# Patient Record
Sex: Male | Born: 1960
Health system: Southern US, Community
[De-identification: ages and names within clinical notes are randomized; demographics above are authoritative.]

## PROBLEM LIST (undated history)

## (undated) DIAGNOSIS — I10 Essential (primary) hypertension: Secondary | ICD-10-CM

## (undated) DIAGNOSIS — S62109A Fracture of unspecified carpal bone, unspecified wrist, initial encounter for closed fracture: Secondary | ICD-10-CM

## (undated) DIAGNOSIS — F17200 Nicotine dependence, unspecified, uncomplicated: Secondary | ICD-10-CM

## (undated) DIAGNOSIS — F1020 Alcohol dependence, uncomplicated: Secondary | ICD-10-CM

## (undated) HISTORY — PX: INCISE AND DRAIN ABCESS: PRO64

---

## 1998-07-23 ENCOUNTER — Inpatient Hospital Stay (HOSPITAL_COMMUNITY): Admission: EM | Admit: 1998-07-23 | Discharge: 1998-07-25 | Payer: Self-pay | Admitting: Emergency Medicine

## 2001-01-18 ENCOUNTER — Emergency Department (HOSPITAL_COMMUNITY): Admission: EM | Admit: 2001-01-18 | Discharge: 2001-01-18 | Payer: Self-pay | Admitting: Emergency Medicine

## 2001-01-18 ENCOUNTER — Encounter: Payer: Self-pay | Admitting: Emergency Medicine

## 2012-07-06 ENCOUNTER — Encounter (HOSPITAL_COMMUNITY): Payer: Self-pay | Admitting: *Deleted

## 2012-07-06 ENCOUNTER — Emergency Department (HOSPITAL_COMMUNITY)
Admission: EM | Admit: 2012-07-06 | Discharge: 2012-07-06 | Disposition: A | Discharge status: Home / Self Care | Payer: Self-pay | Attending: Emergency Medicine | Admitting: Emergency Medicine

## 2012-07-06 DIAGNOSIS — F17200 Nicotine dependence, unspecified, uncomplicated: Secondary | ICD-10-CM | POA: Insufficient documentation

## 2012-07-06 DIAGNOSIS — M25559 Pain in unspecified hip: Secondary | ICD-10-CM | POA: Insufficient documentation

## 2012-07-06 MED ORDER — HYDROCODONE-ACETAMINOPHEN 5-325 MG PO TABS: 2.0000 | ORAL_TABLET | ORAL | Status: AC | PRN

## 2012-07-06 NOTE — ED Notes (Signed)
Pt given discharge and follow up instructions after speaking with provider. Denies further needs at this time. Ambulates to lobby in NAD  

## 2012-07-06 NOTE — ED Notes (Signed)
Pt arrives via personal vehicle with c/o left lower back and hip pain x 2 weeks. Pt states pain began while he as weed eating at work. Pt states he would like referral for physician to seek disability due to pain

## 2012-07-06 NOTE — ED Provider Notes (Signed)
History     CSN: [id number]  Arrival date & time 07/06/12  4098   First MD Initiated Contact with Patient 07/06/12 [phone]      Chief Complaint  Patient presents with  . Hip Pain    left hip pain x weeks. denies specific injury. states it began hurting while he using a weed eater.     (Consider location/radiation/quality/duration/timing/severity/associated sxs/prior treatment) HPI Comments: Patient comes in today with a chief complaint of left sided back pain and left hip pain.  Symptoms have been present for the past month.  No injury or trauma.  Symptoms gradually worsening.  He reports that he does not have any pain at rest, but does have pain with ambulation.  He takes Ibuprofen for the pain, which he reports does help but only for an hour or two.  He denies any erythema.  No fevers or chills.  No numbness or tingling.  Full ROM of left hip.    The history is provided by the patient.    History reviewed. No pertinent past medical history.  History reviewed. No pertinent past surgical history.  History reviewed. No pertinent family history.  History  Substance Use Topics  . Smoking status: Current Everyday Smoker -- 0.5 packs/day  . Smokeless tobacco: Not on file  . Alcohol Use: Yes     1 beer daily      Review of Systems  Constitutional: Negative for fever and chills.  Gastrointestinal: Negative for nausea and vomiting.  Genitourinary: Negative for decreased urine volume and difficulty urinating.       No bowel or bladder incontinence  Musculoskeletal: Positive for back pain. Negative for joint swelling and gait problem.  Skin: Negative for color change and wound.  Neurological: Negative for numbness.    Allergies  Review of patient's allergies indicates no known allergies.  Home Medications   Current Outpatient Rx  Name Route Sig Dispense Refill  . ACETAMINOPHEN 325 MG PO TABS Oral Take 650 mg by mouth every 4 (four) hours as needed. Pain    . NAPROXEN SODIUM  220 MG PO TABS Oral Take 220 mg by mouth 2 (two) times daily with a meal.      BP 159/89  Pulse 70  Temp 97.8 F (36.6 C)  Resp 16  Ht 6' (1.829 m)  Wt 175 lb (79.379 kg)  BMI 23.73 kg/m2  SpO2 100%  Physical Exam  Nursing note and vitals reviewed. Constitutional: He appears well-developed and well-nourished. No distress.  HENT:  Head: Normocephalic and atraumatic.  Mouth/Throat: Oropharynx is clear and moist.  Neck: Normal range of motion. Neck supple.  Cardiovascular: Normal rate, regular rhythm and normal heart sounds.   Pulses:      Dorsalis pedis pulses are 2+ on the right side, and 2+ on the left side.  Pulmonary/Chest: Effort normal and breath sounds normal.  Musculoskeletal:       Left hip: He exhibits normal range of motion, normal strength, no tenderness, no bony tenderness, no swelling and no deformity.       Cervical back: He exhibits normal range of motion, no tenderness, no bony tenderness, no swelling, no edema and no deformity.       Thoracic back: He exhibits normal range of motion, no tenderness, no bony tenderness, no swelling, no edema and no deformity.       Lumbar back: He exhibits normal range of motion, no tenderness, no bony tenderness, no swelling, no edema and no deformity.  Neurological: He  is alert. He has normal strength. No sensory deficit. Gait normal.  Skin: Skin is warm and dry. He is not diaphoretic. No erythema.  Psychiatric: He has a normal mood and affect.    ED Course  Procedures (including critical care time)  Labs Reviewed - No data to display No results found.   No diagnosis found.    MDM  Patient reports that he has been having left hip pain for the past month.  No acute injury or trauma.  No erythema.  Full ROM of the hip.  Patient able to ambulate without difficulty.  Therefore, do not feel that imaging is indicated at this time.  Patient given short course of pain medication and instructed to follow up with PCP.           Pascal Lux Spencer, PA-C 07/06/12 2134

## 2012-07-07 NOTE — ED Provider Notes (Signed)
Medical screening examination/treatment/procedure(s) were performed by non-physician practitioner and as supervising physician I was immediately available for consultation/collaboration.  Cheri Guppy, MD 07/07/12 1126

## 2015-03-14 ENCOUNTER — Encounter (HOSPITAL_COMMUNITY): Payer: Self-pay | Admitting: Emergency Medicine

## 2015-03-14 ENCOUNTER — Ambulatory Visit (HOSPITAL_COMMUNITY): Payer: Self-pay | Admitting: Certified Registered Nurse Anesthetist

## 2015-03-14 ENCOUNTER — Encounter: Payer: Self-pay | Admitting: Ophthalmology

## 2015-03-14 ENCOUNTER — Encounter (HOSPITAL_COMMUNITY)
Admission: RE | Disposition: A | Discharge status: Home / Self Care | Payer: Self-pay | Source: Ambulatory Visit | Attending: Ophthalmology

## 2015-03-14 ENCOUNTER — Encounter (HOSPITAL_COMMUNITY): Payer: Self-pay | Admitting: *Deleted

## 2015-03-14 ENCOUNTER — Emergency Department (INDEPENDENT_AMBULATORY_CARE_PROVIDER_SITE_OTHER)
Admission: EM | Admit: 2015-03-14 | Discharge: 2015-03-14 | Disposition: A | Discharge status: Home / Self Care | Payer: Self-pay | Source: Home / Self Care

## 2015-03-14 ENCOUNTER — Ambulatory Visit (HOSPITAL_COMMUNITY)
Admission: RE | Admit: 2015-03-14 | Discharge: 2015-03-14 | Disposition: A | Discharge status: Home / Self Care | Payer: Self-pay | Source: Ambulatory Visit | Attending: Ophthalmology | Admitting: Ophthalmology

## 2015-03-14 ENCOUNTER — Other Ambulatory Visit: Payer: Self-pay | Admitting: Ophthalmology

## 2015-03-14 DIAGNOSIS — F17200 Nicotine dependence, unspecified, uncomplicated: Secondary | ICD-10-CM | POA: Insufficient documentation

## 2015-03-14 DIAGNOSIS — Y93H2 Activity, gardening and landscaping: Secondary | ICD-10-CM | POA: Insufficient documentation

## 2015-03-14 DIAGNOSIS — S058X1A Other injuries of right eye and orbit, initial encounter: Secondary | ICD-10-CM | POA: Insufficient documentation

## 2015-03-14 DIAGNOSIS — I10 Essential (primary) hypertension: Secondary | ICD-10-CM | POA: Insufficient documentation

## 2015-03-14 DIAGNOSIS — H16031 Corneal ulcer with hypopyon, right eye: Secondary | ICD-10-CM | POA: Insufficient documentation

## 2015-03-14 DIAGNOSIS — W208XXA Other cause of strike by thrown, projected or falling object, initial encounter: Secondary | ICD-10-CM | POA: Insufficient documentation

## 2015-03-14 DIAGNOSIS — Z79899 Other long term (current) drug therapy: Secondary | ICD-10-CM | POA: Insufficient documentation

## 2015-03-14 DIAGNOSIS — S0591XA Unspecified injury of right eye and orbit, initial encounter: Secondary | ICD-10-CM

## 2015-03-14 HISTORY — PX: PARS PLANA VITRECTOMY: SHX2166

## 2015-03-14 HISTORY — DX: Fracture of unspecified carpal bone, unspecified wrist, initial encounter for closed fracture: S62.109A

## 2015-03-14 LAB — CBC
HCT: 38 % — ABNORMAL LOW (ref 39.0–52.0)
Hemoglobin: 12.3 g/dL — ABNORMAL LOW (ref 13.0–17.0)
MCH: 26.2 pg (ref 26.0–34.0)
MCHC: 32.4 g/dL (ref 30.0–36.0)
MCV: 80.9 fL (ref 78.0–100.0)
Platelets: 336 10*3/uL (ref 150–400)
RBC: 4.7 MIL/uL (ref 4.22–5.81)
RDW: 14 % (ref 11.5–15.5)
WBC: 10.7 10*3/uL — ABNORMAL HIGH (ref 4.0–10.5)

## 2015-03-14 LAB — BASIC METABOLIC PANEL
Anion gap: 13 (ref 5–15)
BUN: 8 mg/dL (ref 6–23)
CALCIUM: 9.3 mg/dL (ref 8.4–10.5)
CHLORIDE: 99 mmol/L (ref 96–112)
CO2: 26 mmol/L (ref 19–32)
CREATININE: 0.99 mg/dL (ref 0.50–1.35)
GFR calc Af Amer: >90 mL/min (ref >90)
GFR calc non Af Amer: >90 mL/min (ref >90)
GLUCOSE: 80 mg/dL (ref 70–99)
POTASSIUM: 3.3 mmol/L — AB (ref 3.5–5.1)
Sodium: 138 mmol/L (ref 135–145)

## 2015-03-14 LAB — POCT I-STAT 4, (NA,K, GLUC, HGB,HCT)
GLUCOSE: 82 mg/dL (ref 70–99)
HCT: 42 % (ref 39.0–52.0)
Hemoglobin: 14.3 g/dL (ref 13.0–17.0)
Potassium: 3.3 mmol/L — ABNORMAL LOW (ref 3.5–5.1)
SODIUM: 138 mmol/L (ref 135–145)

## 2015-03-14 SURGERY — PARS PLANA VITRECTOMY 25 GAUGE FOR ENDOPHTHALMITIS
Anesthesia: General | Site: Eye | Laterality: Right

## 2015-03-14 SURGERY — PARS PLANA VITRECTOMY 25 GAUGE FOR ENDOPHTHALMITIS
Anesthesia: General | Laterality: Right

## 2015-03-14 MED ORDER — GATIFLOXACIN 0.5 % OP SOLN
1.0000 [drp] | Freq: Once | OPHTHALMIC | Status: DC
Start: 2015-03-14 — End: 2015-03-15
  Filled 2015-03-14: qty 2.5

## 2015-03-14 MED ORDER — FENTANYL CITRATE 0.05 MG/ML IJ SOLN
INTRAMUSCULAR | Status: AC
  Filled 2015-03-14: qty 5

## 2015-03-14 MED ORDER — POLYMYXIN B SULFATE 500000 UNITS IJ SOLR
INTRAMUSCULAR | Status: AC
  Filled 2015-03-14: qty 1

## 2015-03-14 MED ORDER — LACTATED RINGERS IV SOLN
INTRAVENOUS | Status: DC
  Administered 2015-03-14: 19:00:00 via INTRAVENOUS

## 2015-03-14 MED ORDER — EPINEPHRINE HCL 1 MG/ML IJ SOLN
INTRAMUSCULAR | Status: AC
  Filled 2015-03-14: qty 1

## 2015-03-14 MED ORDER — ACETAZOLAMIDE SODIUM 500 MG IJ SOLR
INTRAMUSCULAR | Status: AC
  Filled 2015-03-14: qty 500

## 2015-03-14 MED ORDER — LIDOCAINE HCL (CARDIAC) 20 MG/ML IV SOLN
INTRAVENOUS | Status: AC
  Filled 2015-03-14: qty 10

## 2015-03-14 MED ORDER — LIDOCAINE HCL 4 % MT SOLN
OROMUCOSAL | Status: DC | PRN
  Administered 2015-03-14: 4 mL via TOPICAL

## 2015-03-14 MED ORDER — FENTANYL CITRATE 0.05 MG/ML IJ SOLN
INTRAMUSCULAR | Status: DC | PRN
  Administered 2015-03-14 (×5): 50 ug via INTRAVENOUS

## 2015-03-14 MED ORDER — CEFTAZIDIME INTRAVITREAL INJECTION 2.25 MG/0.1 ML
INTRAVITREAL | Status: DC | PRN
  Administered 2015-03-14: 2.25 mg via INTRAVITREAL

## 2015-03-14 MED ORDER — BSS IO SOLN
INTRAOCULAR | Status: AC
  Filled 2015-03-14: qty 15

## 2015-03-14 MED ORDER — ARTIFICIAL TEARS OP OINT
TOPICAL_OINTMENT | OPHTHALMIC | Status: AC
  Filled 2015-03-14: qty 3.5

## 2015-03-14 MED ORDER — SODIUM HYALURONATE 10 MG/ML IO SOLN
INTRAOCULAR | Status: AC
  Filled 2015-03-14: qty 0.85

## 2015-03-14 MED ORDER — HYDROMORPHONE HCL 1 MG/ML IJ SOLN: 0.2500 mg | INTRAMUSCULAR | Status: DC | PRN

## 2015-03-14 MED ORDER — BSS PLUS IO SOLN
INTRAOCULAR | Status: AC
  Filled 2015-03-14: qty 500

## 2015-03-14 MED ORDER — PROPOFOL 10 MG/ML IV BOLUS
INTRAVENOUS | Status: AC
Start: 2015-03-14 — End: 2015-03-14
  Filled 2015-03-14: qty 20

## 2015-03-14 MED ORDER — SODIUM CHLORIDE 0.9 % IJ SOLN
INTRAMUSCULAR | Status: AC
  Filled 2015-03-14: qty 10

## 2015-03-14 MED ORDER — DEXAMETHASONE SODIUM PHOSPHATE 10 MG/ML IJ SOLN
INTRAMUSCULAR | Status: AC
  Filled 2015-03-14: qty 1

## 2015-03-14 MED ORDER — BACITRACIN-POLYMYXIN B 500-10000 UNIT/GM OP OINT
TOPICAL_OINTMENT | OPHTHALMIC | Status: AC
  Filled 2015-03-14: qty 3.5

## 2015-03-14 MED ORDER — HYALURONIDASE HUMAN 150 UNIT/ML IJ SOLN
INTRAMUSCULAR | Status: AC
  Filled 2015-03-14: qty 1

## 2015-03-14 MED ORDER — DEXAMETHASONE SODIUM PHOSPHATE 10 MG/ML IJ SOLN
INTRAMUSCULAR | Status: DC | PRN
  Administered 2015-03-14: 10 mg via INTRAVENOUS

## 2015-03-14 MED ORDER — VANCOMYCIN INTRAVITREAL INJECTION 1 MG/0.1 ML
1.0000 mg | INTRAOCULAR | Status: DC
  Filled 2015-03-14: qty 0.1

## 2015-03-14 MED ORDER — NEOSTIGMINE METHYLSULFATE 10 MG/10ML IV SOLN
INTRAVENOUS | Status: AC
  Filled 2015-03-14: qty 1

## 2015-03-14 MED ORDER — ROCURONIUM BROMIDE 50 MG/5ML IV SOLN
INTRAVENOUS | Status: AC
  Filled 2015-03-14: qty 1

## 2015-03-14 MED ORDER — CEFTAZIDIME INTRAVITREAL INJECTION 2.25 MG/0.1 ML
2.2500 mg | INTRAVITREAL | Status: DC
  Filled 2015-03-14: qty 0.1

## 2015-03-14 MED ORDER — PROPOFOL 10 MG/ML IV BOLUS
INTRAVENOUS | Status: DC | PRN
  Administered 2015-03-14: 150 mg via INTRAVENOUS

## 2015-03-14 MED ORDER — HYDRALAZINE HCL 20 MG/ML IJ SOLN
INTRAMUSCULAR | Status: DC | PRN
  Administered 2015-03-14 (×2): 5 mg via INTRAVENOUS

## 2015-03-14 MED ORDER — PROMETHAZINE HCL 25 MG/ML IJ SOLN: 6.2500 mg | INTRAMUSCULAR | Status: DC | PRN

## 2015-03-14 MED ORDER — BSS IO SOLN
INTRAOCULAR | Status: DC | PRN
  Administered 2015-03-14: 500 mL

## 2015-03-14 MED ORDER — EPINEPHRINE HCL 1 MG/ML IJ SOLN
INTRAOCULAR | Status: DC | PRN
  Administered 2015-03-14: .3 mL

## 2015-03-14 MED ORDER — TETRACAINE HCL 0.5 % OP SOLN
OPHTHALMIC | Status: AC
  Filled 2015-03-14: qty 2

## 2015-03-14 MED ORDER — LIDOCAINE HCL (CARDIAC) 20 MG/ML IV SOLN
INTRAVENOUS | Status: AC
  Filled 2015-03-14: qty 5

## 2015-03-14 MED ORDER — BUPIVACAINE HCL (PF) 0.75 % IJ SOLN
INTRAMUSCULAR | Status: AC
  Filled 2015-03-14: qty 10

## 2015-03-14 MED ORDER — NEOSTIGMINE METHYLSULFATE 10 MG/10ML IV SOLN
INTRAVENOUS | Status: DC | PRN
  Administered 2015-03-14: 5 mg via INTRAVENOUS

## 2015-03-14 MED ORDER — ATROPINE SULFATE 1 % OP SOLN
OPHTHALMIC | Status: AC
  Filled 2015-03-14: qty 5

## 2015-03-14 MED ORDER — GLYCOPYRROLATE 0.2 MG/ML IJ SOLN
INTRAMUSCULAR | Status: AC
  Filled 2015-03-14: qty 3

## 2015-03-14 MED ORDER — LACTATED RINGERS IV SOLN
INTRAVENOUS | Status: DC | PRN
  Administered 2015-03-14 (×2): via INTRAVENOUS

## 2015-03-14 MED ORDER — HYDRALAZINE HCL 20 MG/ML IJ SOLN
INTRAMUSCULAR | Status: AC
  Filled 2015-03-14: qty 1

## 2015-03-14 MED ORDER — GATIFLOXACIN 0.5 % OP SOLN: 1.0000 [drp] | Freq: Four times a day (QID) | OPHTHALMIC | Status: DC

## 2015-03-14 MED ORDER — ROCURONIUM BROMIDE 50 MG/5ML IV SOLN
INTRAVENOUS | Status: AC
  Filled 2015-03-14: qty 2

## 2015-03-14 MED ORDER — MIDAZOLAM HCL 2 MG/2ML IJ SOLN
INTRAMUSCULAR | Status: AC
  Filled 2015-03-14: qty 2

## 2015-03-14 MED ORDER — HYPROMELLOSE (GONIOSCOPIC) 2.5 % OP SOLN
OPHTHALMIC | Status: DC | PRN
  Administered 2015-03-14: 2 [drp]

## 2015-03-14 MED ORDER — LEVOFLOXACIN IN D5W 500 MG/100ML IV SOLN
INTRAVENOUS | Status: DC | PRN
  Administered 2015-03-14: 500 mg via INTRAVENOUS

## 2015-03-14 MED ORDER — ATROPINE SULFATE 1 % OP SOLN
OPHTHALMIC | Status: DC | PRN
  Administered 2015-03-14: 3 [drp] via OPHTHALMIC

## 2015-03-14 MED ORDER — PHENYLEPHRINE 40 MCG/ML (10ML) SYRINGE FOR IV PUSH (FOR BLOOD PRESSURE SUPPORT)
PREFILLED_SYRINGE | INTRAVENOUS | Status: AC
  Filled 2015-03-14: qty 10

## 2015-03-14 MED ORDER — DEXAMETHASONE SODIUM PHOSPHATE 4 MG/ML IJ SOLN
INTRAMUSCULAR | Status: AC
  Filled 2015-03-14: qty 1

## 2015-03-14 MED ORDER — VANCOMYCIN INTRAVITREAL INJECTION 1 MG/0.1 ML
INTRAOCULAR | Status: DC | PRN
  Administered 2015-03-14: 1 mg via INTRAVITREAL

## 2015-03-14 MED ORDER — EPHEDRINE SULFATE 50 MG/ML IJ SOLN
INTRAMUSCULAR | Status: AC
  Filled 2015-03-14: qty 1

## 2015-03-14 MED ORDER — SUCCINYLCHOLINE CHLORIDE 20 MG/ML IJ SOLN
INTRAMUSCULAR | Status: AC
  Filled 2015-03-14: qty 1

## 2015-03-14 MED ORDER — TOBRAMYCIN-DEXAMETHASONE 0.3-0.1 % OP OINT
TOPICAL_OINTMENT | OPHTHALMIC | Status: AC
  Filled 2015-03-14: qty 3.5

## 2015-03-14 MED ORDER — GLYCOPYRROLATE 0.2 MG/ML IJ SOLN
INTRAMUSCULAR | Status: DC | PRN
  Administered 2015-03-14: .7 mg via INTRAVENOUS
  Administered 2015-03-14: .2 mg via INTRAVENOUS

## 2015-03-14 MED ORDER — DEXAMETHASONE SODIUM PHOSPHATE 4 MG/ML IJ SOLN
INTRAMUSCULAR | Status: DC | PRN
  Administered 2015-03-14: 4 mg via INTRAVENOUS

## 2015-03-14 MED ORDER — TRIAMCINOLONE ACETONIDE 40 MG/ML IJ SUSP
INTRAMUSCULAR | Status: AC
  Filled 2015-03-14: qty 5

## 2015-03-14 MED ORDER — LEVOFLOXACIN IN D5W 500 MG/100ML IV SOLN
500.0000 mg | Freq: Once | INTRAVENOUS | Status: DC
  Filled 2015-03-14: qty 100

## 2015-03-14 MED ORDER — BACITRACIN 500 UNIT/GM OP OINT
TOPICAL_OINTMENT | OPHTHALMIC | Status: DC | PRN
  Administered 2015-03-14: 1 via OPHTHALMIC

## 2015-03-14 MED ORDER — HYPROMELLOSE (GONIOSCOPIC) 2.5 % OP SOLN
OPHTHALMIC | Status: AC
  Filled 2015-03-14: qty 15

## 2015-03-14 MED ORDER — ROCURONIUM BROMIDE 100 MG/10ML IV SOLN
INTRAVENOUS | Status: DC | PRN
  Administered 2015-03-14 (×4): 10 mg via INTRAVENOUS
  Administered 2015-03-14: 40 mg via INTRAVENOUS
  Administered 2015-03-14: 10 mg via INTRAVENOUS

## 2015-03-14 MED ORDER — MIDAZOLAM HCL 5 MG/5ML IJ SOLN
INTRAMUSCULAR | Status: DC | PRN
  Administered 2015-03-14: 2 mg via INTRAVENOUS

## 2015-03-14 MED ORDER — ONDANSETRON HCL 4 MG/2ML IJ SOLN
INTRAMUSCULAR | Status: AC
  Filled 2015-03-14: qty 2

## 2015-03-14 MED ORDER — ETOMIDATE 2 MG/ML IV SOLN
INTRAVENOUS | Status: AC
  Filled 2015-03-14: qty 10

## 2015-03-14 MED ORDER — ONDANSETRON HCL 4 MG/2ML IJ SOLN
INTRAMUSCULAR | Status: DC | PRN
  Administered 2015-03-14: 4 mg via INTRAVENOUS

## 2015-03-14 MED ORDER — GENTAMICIN SULFATE 40 MG/ML IJ SOLN
INTRAMUSCULAR | Status: AC
  Filled 2015-03-14: qty 2

## 2015-03-14 MED ORDER — LIDOCAINE HCL (CARDIAC) 20 MG/ML IV SOLN
INTRAVENOUS | Status: DC | PRN
  Administered 2015-03-14: 70 mg via INTRAVENOUS

## 2015-03-14 MED ORDER — LIDOCAINE HCL 2 % IJ SOLN
INTRAMUSCULAR | Status: AC
  Filled 2015-03-14: qty 20

## 2015-03-14 SURGICAL SUPPLY — 57 items
APPLICATOR COTTON TIP 6IN STRL (MISCELLANEOUS) ×3 IMPLANT
BLADE MVR KNIFE 20G (BLADE) IMPLANT
CANNULA ANT CHAM MAIN (OPHTHALMIC RELATED) IMPLANT
CANNULA DUAL BORE 23G (CANNULA) IMPLANT
CANNULA VLV SOFT TIP 25G (OPHTHALMIC) ×1 IMPLANT
CANNULA VLV SOFT TIP 25GA (OPHTHALMIC) ×3 IMPLANT
CAUTERY EYE LOW TEMP 1300F FIN (OPHTHALMIC RELATED) IMPLANT
CORDS BIPOLAR (ELECTRODE) IMPLANT
COVER MAYO STAND STRL (DRAPES) ×3 IMPLANT
DRAPE INCISE 51X51 W/FILM STRL (DRAPES) ×3 IMPLANT
DRAPE PROXIMA HALF (DRAPES) ×3 IMPLANT
DRAPE RETRACTOR (MISCELLANEOUS) ×3 IMPLANT
ERASER HMR WETFIELD 23G BP (MISCELLANEOUS) IMPLANT
FORCEPS ECKARDT ILM 25G SERR (OPHTHALMIC RELATED) IMPLANT
FORCEPS GRIESHABER ILM 25G A (INSTRUMENTS) IMPLANT
GAS AUTO FILL CONSTEL (OPHTHALMIC)
GAS AUTO FILL CONSTELLATION (OPHTHALMIC) IMPLANT
GLOVE BIOGEL PI IND STRL 7.5 (GLOVE) ×1 IMPLANT
GLOVE BIOGEL PI INDICATOR 7.5 (GLOVE) ×2
GOWN STRL REUS W/ TWL LRG LVL3 (GOWN DISPOSABLE) ×1 IMPLANT
GOWN STRL REUS W/TWL LRG LVL3 (GOWN DISPOSABLE) ×3
HANDLE PNEUMATIC FOR CONSTEL (OPHTHALMIC) IMPLANT
KIT BASIN OR (CUSTOM PROCEDURE TRAY) ×3 IMPLANT
KIT ROOM TURNOVER OR (KITS) ×3 IMPLANT
LENS BIOM SUPER VIEW SET DISP (OPHTHALMIC RELATED) ×3 IMPLANT
MICROPICK 25G (MISCELLANEOUS)
NDL 18GX1X1/2 (RX/OR ONLY) (NEEDLE) ×1 IMPLANT
NDL 25GX 5/8IN NON SAFETY (NEEDLE) ×1 IMPLANT
NDL FILTER BLUNT 18X1 1/2 (NEEDLE) ×1 IMPLANT
NDL HYPO 25GX1X1/2 BEV (NEEDLE) IMPLANT
NDL HYPO 30X.5 LL (NEEDLE) ×2 IMPLANT
NDL RETROBULBAR 25GX1.5 (NEEDLE) ×1 IMPLANT
NEEDLE 18GX1X1/2 (RX/OR ONLY) (NEEDLE) ×3 IMPLANT
NEEDLE 25GX 5/8IN NON SAFETY (NEEDLE) ×3 IMPLANT
NEEDLE FILTER BLUNT 18X 1/2SAF (NEEDLE) ×2
NEEDLE FILTER BLUNT 18X1 1/2 (NEEDLE) ×1 IMPLANT
NEEDLE HYPO 25GX1X1/2 BEV (NEEDLE) IMPLANT
NEEDLE HYPO 30X.5 LL (NEEDLE) ×6 IMPLANT
NEEDLE RETROBULBAR 25GX1.5 (NEEDLE) ×3 IMPLANT
NS IRRIG 1000ML POUR BTL (IV SOLUTION) ×3 IMPLANT
PAD ARMBOARD 7.5X6 YLW CONV (MISCELLANEOUS) ×6 IMPLANT
PAK PIK VITRECTOMY CVS 25GA (OPHTHALMIC) ×3 IMPLANT
PENCIL BIPOLAR 25GA STR DISP (OPHTHALMIC RELATED) IMPLANT
PICK MICROPICK 25G (MISCELLANEOUS) IMPLANT
PROBE LASER ILLUM FLEX CVD 25G (OPHTHALMIC) ×2 IMPLANT
ROLLS DENTAL (MISCELLANEOUS) ×2 IMPLANT
SCRAPER DIAMOND 25GA (OPHTHALMIC RELATED) IMPLANT
STOPCOCK 4 WAY LG BORE MALE ST (IV SETS) IMPLANT
SUT NYLON 10.0 BLK (SUTURE) ×2 IMPLANT
SUT VICRYL 7 0 TG140 8 (SUTURE) ×3 IMPLANT
SYR 20CC LL (SYRINGE) ×3 IMPLANT
SYR 5ML LL (SYRINGE) ×3 IMPLANT
SYR TB 1ML LUER SLIP (SYRINGE) ×4 IMPLANT
SYRINGE 10CC LL (SYRINGE) IMPLANT
TOWEL OR 17X24 6PK STRL BLUE (TOWEL DISPOSABLE) ×4 IMPLANT
WATER STERILE IRR 1000ML POUR (IV SOLUTION) ×1 IMPLANT
WIPE INSTRUMENT VISIWIPE 73X73 (MISCELLANEOUS) ×3 IMPLANT

## 2015-03-14 NOTE — Progress Notes (Signed)
Pt noted to have elevated blood pressure 230s/90s. Pt denies being diagnosed with HTN but reports that he does not get regular checkups. Pt denies chest pain, shortness of breath, cough or fever.

## 2015-03-14 NOTE — ED Provider Notes (Signed)
Tillie RungClyde A Schmierer is a 54 y.o. male who presents to Urgent Care today for right eye problem. Patient was at work and was hit in the eye by a foreign body a week ago. Since then he's had complete loss of vision in that eye. He notes redness and pain. He's been using some over-the-counter eyedrops which have not helped. No fevers or chills nausea vomiting or diarrhea.   History reviewed. No pertinent past medical history. History reviewed. No pertinent past surgical history. History  Substance Use Topics  . Smoking status: Current Every Day Smoker -- 0.50 packs/day  . Smokeless tobacco: Not on file  . Alcohol Use: Yes     Comment: 1 beer daily   ROS as above Medications: No current facility-administered medications for this encounter.   Current Outpatient Prescriptions  Medication Sig Dispense Refill  . acetaminophen (TYLENOL) 325 MG tablet Take 650 mg by mouth every 4 (four) hours as needed. Pain    . naproxen sodium (ANAPROX) 220 MG tablet Take 220 mg by mouth 2 (two) times daily with a meal.     No Known Allergies   Exam:  BP 177/86 mmHg  Pulse 75  Temp(Src) 98.5 F (36.9 C) (Oral)  Resp 16  SpO2 96% Gen: Well NAD HEENT: EOMI,  MMM right eye with significant deep erythema of the entire sclera with wavy appearance and hazy gray opacity of the lenses.    No results found for this or any previous visit (from the past 24 hour(s)). No results found.  Assessment and Plan: 54 y.o. male with serious eye injury. Concerning for globe rupture. Patient will be seen today by ophthalmology.  Discussed warning signs or symptoms. Please see discharge instructions. Patient expresses understanding.     Rodolph BongEvan S Mackenzi Krogh, MD 03/14/15 [phone]

## 2015-03-14 NOTE — Discharge Instructions (Signed)
Keep patch in place.  F/u in eye clinic [address] tomorrow morning at [facility]9am 03/15/2015

## 2015-03-14 NOTE — Discharge Instructions (Signed)
Thank you for coming in today. Go directly to the ophthalmology office right now.

## 2015-03-14 NOTE — Anesthesia Postprocedure Evaluation (Signed)
  Anesthesia Post-op Note  Patient: Derrick Andrade  Procedure(s) Performed: Procedure(s) (LRB): Exploration Right Ruptured Globe, Vitrous Tap, Antibiotic Injection, PARS PLANA VITRECTOMY 25 GAUGE WITH LASER (Right)  Patient Location: PACU  Anesthesia Type: General  Level of Consciousness: awake and alert   Airway and Oxygen Therapy: Patient Spontanous Breathing  Post-op Pain: mild  Post-op Assessment: Post-op Vital signs reviewed, Patient's Cardiovascular Status Stable, Respiratory Function Stable, Patent Airway and No signs of Nausea or vomiting  Last Vitals:  Filed Vitals:   03/14/15 2200  BP: 182/80  Pulse: 70  Temp:   Resp:     Post-op Vital Signs: stable   Complications: No apparent anesthesia complications

## 2015-03-14 NOTE — ED Notes (Signed)
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

## 2015-03-14 NOTE — Anesthesia Preprocedure Evaluation (Addendum)
Anesthesia Evaluation  Patient identified by MRN, date of birth, ID band Patient awake    Reviewed: Allergy & Precautions, NPO status , Patient's Chart, lab work & pertinent test results  Airway Mallampati: II  TM Distance: >3 FB Neck ROM: Full    Dental no notable dental hx.    Pulmonary Current Smoker,  breath sounds clear to auscultation  Pulmonary exam normal       Cardiovascular hypertension, Rhythm:Regular Rate:Normal  Probable untreated HTN   Neuro/Psych negative neurological ROS  negative psych ROS   GI/Hepatic negative GI ROS, Neg liver ROS,   Endo/Other  negative endocrine ROS  Renal/GU negative Renal ROS  negative genitourinary   Musculoskeletal negative musculoskeletal ROS (+)   Abdominal   Peds negative pediatric ROS (+)  Hematology negative hematology ROS (+)   Anesthesia Other Findings   Reproductive/Obstetrics negative OB ROS                            Anesthesia Physical Anesthesia Plan  ASA: III and emergent  Anesthesia Plan: General   Post-op Pain Management:    Induction: Intravenous and Cricoid pressure planned  Airway Management Planned: Oral ETT  Additional Equipment:   Intra-op Plan:   Post-operative Plan: Extubation in OR  Informed Consent: I have reviewed the patients History and Physical, chart, labs and discussed the procedure including the risks, benefits and alternatives for the proposed anesthesia with the patient or authorized representative who has indicated his/her understanding and acceptance.   Dental advisory given  Plan Discussed with: CRNA and Surgeon  Anesthesia Plan Comments:        Anesthesia Quick Evaluation

## 2015-03-14 NOTE — Brief Op Note (Signed)
03/14/2015  9:31 PM  PATIENT:  Derrick Andrade  54 y.o. male  PRE-OPERATIVE DIAGNOSIS:  Right Ruptured Globe, ENDOPHTHALMITIS  POST-OPERATIVE DIAGNOSIS:  * No post-op diagnosis entered *  PROCEDURE:  Procedure(s): Exploration Right Ruptured Globe, Vitrous Tap, Antibiotic Injection, possible PARS PLANA VITRECTOMY 25 GAUGE (Right) with Endolaser left eye  SURGEON:  Surgeon(s) and Role:    * Carmela RimaNarendra Sofiah Lyne, MD - Primary  PHYSICIAN ASSISTANT:   ASSISTANTS: none   ANESTHESIA:   general  EBL:  Total I/O In: 1000 [I.V.:1000] Out: -   BLOOD ADMINISTERED:none  DRAINS: none   LOCAL MEDICATIONS USED:  NONE  SPECIMEN:  Aspirate from anterior chamber hypopion  DISPOSITION OF SPECIMEN:  Microbiology   COUNTS:  YES  TOURNIQUET:  * No tourniquets in log *  DICTATION: .Note written in EPIC  PLAN OF CARE: Discharge to home after PACU  PATIENT DISPOSITION:  PACU - hemodynamically stable.   Delay start of Pharmacological VTE agent (>24hrs) due to surgical blood loss or risk of bleeding: no

## 2015-03-14 NOTE — H&P (Signed)
Derrick Andrade is an 54 y.o. male.   Chief Complaint: decreased vision/pain right eye  HPI: rock injury to right eye with pain (Patient was using a weed wacker)  Past Medical History  Diagnosis Date  . Wrist fracture     left    Past Surgical History  Procedure Laterality Date  . Incise and drain abcess      "boil" on buttock    History reviewed. No pertinent family history. Social History:  reports that he has been smoking.  He has never used smokeless tobacco. He reports that he drinks alcohol. He reports that he does not use illicit drugs.  Allergies: No Known Allergies  Medications Prior to Admission  Medication Sig Dispense Refill  . acetaminophen (TYLENOL) 325 MG tablet Take 650 mg by mouth every 4 (four) hours as needed. Pain    . Carboxymethylcellul-Glycerin (CLEAR EYES FOR DRY EYES OP) Apply 1-2 drops to eye as needed (Red eyes).      Results for orders placed or performed during the hospital encounter of 03/14/15 (from the past 48 hour(s))  I-STAT 4, (NA,K, GLUC, HGB,HCT)     Status: Abnormal   Collection Time: 03/14/15  6:37 PM  Result Value Ref Range   Sodium 138 135 - 145 mmol/L   Potassium 3.3 (L) 3.5 - 5.1 mmol/L   Glucose, Bld 82 70 - 99 mg/dL   HCT 16.142.0 09.639.0 - 04.552.0 %   Hemoglobin 14.3 13.0 - 17.0 g/dL   No results found.  Review of Systems  Constitutional: Negative.   Eyes: Positive for blurred vision, pain and redness.    Blood pressure 222/98, pulse 57, temperature 99.1 F (37.3 C), temperature source Oral, resp. rate 16, height 6\' 1"  (1.854 m), weight 79.379 kg (175 lb), SpO2 100 %. Physical Exam  Eyes: Right eye exhibits discharge.   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  Right globe exploration with possible vitrectomy, globe rupture repair and placement of intravitreal antibiotics  Derrick Andrade 03/14/2015, 6:53  PM

## 2015-03-14 NOTE — Transfer of Care (Signed)
Immediate Anesthesia Transfer of Care Note  Patient: Derrick Andrade  Procedure(s) Performed: Procedure(s): Exploration Right Ruptured Globe, Vitrous Tap, Antibiotic Injection, possible PARS PLANA VITRECTOMY 25 GAUGE (Right)  Patient Location: PACU  Anesthesia Type:General  Level of Consciousness: awake and patient cooperative  Airway & Oxygen Therapy: Patient Spontanous Breathing and Patient connected to face mask oxygen  Post-op Assessment: Report given to RN and Post -op Vital signs reviewed and stable  Post vital signs: Reviewed and stable  Last Vitals:  Filed Vitals:   03/14/15 2140  BP: 178/88  Pulse: 76  Temp: 36.7 C  Resp: 12    Complications: No apparent anesthesia complications

## 2015-03-14 NOTE — Anesthesia Procedure Notes (Signed)
Procedure Name: Intubation Date/Time: 03/14/2015 7:20 PM Performed by: Hollie Salk Z Pre-anesthesia Checklist: Patient identified, Patient being monitored, Timeout performed, Emergency Drugs available and Suction available Patient Re-evaluated:Patient Re-evaluated prior to inductionOxygen Delivery Method: Circle system utilized Preoxygenation: Pre-oxygenation with 100% oxygen Intubation Type: Cricoid Pressure applied and Combination inhalational/ intravenous induction Ventilation: Mask ventilation without difficulty Laryngoscope Size: Mac and 4 Grade View: Grade III Tube type: Oral Tube size: 7.5 mm Number of attempts: 1 Airway Equipment and Method: Stylet and LTA kit utilized Placement Confirmation: ETT inserted through vocal cords under direct vision,  breath sounds checked- equal and bilateral and positive ETCO2 Secured at: 24 cm Tube secured with: Tape Dental Injury: Teeth and Oropharynx as per pre-operative assessment  Difficulty Due To: Difficult Airway- due to immobile epiglottis

## 2015-03-15 ENCOUNTER — Encounter (HOSPITAL_COMMUNITY): Payer: Self-pay | Admitting: Ophthalmology

## 2015-03-19 LAB — EYE CULTURE

## 2015-03-29 NOTE — Op Note (Signed)
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  A lid speculum was placed.  AC tap was performed with 26 gauge needle and hypopyon was extracted and sent for culture.  AC washout was performed after stab incision were made in the cornea to allow for irrigation.    Infusion line and trocar was placed at the 8 o'clock position approximately 4 mm from the surgical limbus.   The infusion line was allowed to run and then clamped when placed at the cannula opening. The line was inserted and secured to the drape with an adhesive strip.   Active trocars/cannula were placed at the 10 and 2 o'clock positions approximately 4 mm from the surgical limbus. The cannula was visualized in the vitreous cavity.  The light pipe and vitreous cutter were inserted into the vitreous cavity and a core vitrectomy was performed. There was a difficult view due to the corneal ulcer and infiltrate from the lamellar corneal wound the eye suffered (rock to right eye).  Following core vitrectomy and careful peripheral vitrectomy, endolaser was applied 360 degrees.  A partial air fluid exchange was performed, the trocars sequentially removed and all noted to be air tight.  Ceftazadime 2.25 mg in 0.1 ml and Vancomycin 1 mg in 0.1 ml was placed in the eye.   Normal intraocular pressure was noted by digital palpapation.  Subconjunctival injection of  Dexamethasone 4mg/1ml was placed.   The speculum and drapes were removed and the eye was patched with Polymixin/Bacitracin ophthalmic ointment. An eye shield was placed and the patient was transferred  alert and conversant with stable vital signs to the post operative recovery area.  The patient tolerated the procedure well and no complications were noted.  [name] MD 

## 2015-03-29 NOTE — Procedures (Signed)
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  A lid speculum was placed.  AC tap was performed with 26 gauge needle and hypopyon was extracted and sent for culture.  AC washout was performed after stab incision were made in the cornea to allow for irrigation.    Infusion line and trocar was placed at the 8 o'clock position approximately 4 mm from the surgical limbus.   The infusion line was allowed to run and then clamped when placed at the cannula opening. The line was inserted and secured to the drape with an adhesive strip.   Active trocars/cannula were placed at the 10 and 2 o'clock positions approximately 4 mm from the surgical limbus. The cannula was visualized in the vitreous cavity.  The light pipe and vitreous cutter were inserted into the vitreous cavity and a core vitrectomy was performed. There was a difficult view due to the corneal ulcer and infiltrate from the lamellar corneal wound the eye suffered (rock to right eye).  Following core vitrectomy and careful peripheral vitrectomy, endolaser was applied 360 degrees.  A partial air fluid exchange was performed, the trocars sequentially removed and all noted to be air tight.  Ceftazadime 2.25 mg in 0.1 ml and Vancomycin 1 mg in 0.1 ml was placed in the eye.   Normal intraocular pressure was noted by digital palpapation.  Subconjunctival injection of  Dexamethasone 4mg /491ml was placed.   The speculum and drapes were removed and the eye was patched with Polymixin/Bacitracin ophthalmic ointment. An eye shield was placed and the patient was transferred  alert and conversant with stable vital signs to the post operative recovery area.  The patient tolerated the procedure well and no complications were noted.  Harrold DonathPatel,Voncile Schwarz Mafabhai MD

## 2015-03-30 ENCOUNTER — Encounter: Payer: Self-pay | Admitting: Internal Medicine

## 2015-03-30 ENCOUNTER — Ambulatory Visit: Payer: Self-pay | Attending: Internal Medicine | Admitting: Internal Medicine

## 2015-03-30 VITALS — BP 151/96 | HR 76 | Temp 98.5°F | Resp 16 | Ht 73.0 in | Wt 175.0 lb

## 2015-03-30 DIAGNOSIS — H53131 Sudden visual loss, right eye: Secondary | ICD-10-CM

## 2015-03-30 DIAGNOSIS — H538 Other visual disturbances: Secondary | ICD-10-CM | POA: Insufficient documentation

## 2015-03-30 DIAGNOSIS — S0591XD Unspecified injury of right eye and orbit, subsequent encounter: Secondary | ICD-10-CM

## 2015-03-30 DIAGNOSIS — H5461 Unqualified visual loss, right eye, normal vision left eye: Secondary | ICD-10-CM | POA: Insufficient documentation

## 2015-03-30 DIAGNOSIS — R030 Elevated blood-pressure reading, without diagnosis of hypertension: Secondary | ICD-10-CM | POA: Insufficient documentation

## 2015-03-30 DIAGNOSIS — S0531XS Ocular laceration without prolapse or loss of intraocular tissue, right eye, sequela: Secondary | ICD-10-CM | POA: Insufficient documentation

## 2015-03-30 DIAGNOSIS — W458XXS Other foreign body or object entering through skin, sequela: Secondary | ICD-10-CM | POA: Insufficient documentation

## 2015-03-30 DIAGNOSIS — F17200 Nicotine dependence, unspecified, uncomplicated: Secondary | ICD-10-CM | POA: Insufficient documentation

## 2015-03-30 DIAGNOSIS — IMO0001 Reserved for inherently not codable concepts without codable children: Secondary | ICD-10-CM

## 2015-03-30 NOTE — Progress Notes (Signed)
Pt is here to establish care. Pt is needing a referral to have his eye checked to see an eye doctor. Pt reports having frequent migraines that he treats with tylenol.

## 2015-03-30 NOTE — Progress Notes (Signed)
Patient ID: Derrick Andrade, male   DOB: 03/09/1961, 54 y.o.   MRN: [id number]  CC: Right eye injury  HPI: Derrick Andrade is a 54 y.o. male here today to establish care  Patient has no past medical history. Patient reports that he was at work on March 30 cut grass when a foreign object flew into his right eye. Patient reports at that time he went to the local emergency department who then referred him to an eye specialist. He later that day went into surgery for a ruptured right globe. Patient reports that he was given multiple medications to prevent vision loss and infection. He has since been taken that medication but has not had a follow-up visit with a ophthalmologist. Patient reports that he has noticed severe headaches since he had his eye surgery on March 30th. Headaches are exacerbated by taking Diamox. Patient reports that the headaches are daily and they wake him up at night. Patient reports over-the-counter medications are not helping his headaches. He has been out of work due to vision loss of the right side and severe blurred vision of the left eye. He reports that he was referred to Dr. Lavona Moundgrout but was told that he needed to pay cash before being seen. He reports he has no money for a follow-up eye appointment as well as no insurance.  No Known Allergies Past Medical History  Diagnosis Date  . Wrist fracture     left   Current Outpatient Prescriptions on File Prior to Visit  Medication Sig Dispense Refill  . acetaminophen (TYLENOL) 325 MG tablet Take 650 mg by mouth every 4 (four) hours as needed. Pain    . Carboxymethylcellul-Glycerin (CLEAR EYES FOR DRY EYES OP) Apply 1-2 drops to eye as needed (Red eyes).    Marland Kitchen. gatifloxacin (ZYMAXID) 0.5 % SOLN Place 1 drop into the right eye 4 (four) times daily. 1 Bottle 1   No current facility-administered medications on file prior to visit.   History reviewed. No pertinent family history. History   Social History  . Marital Status: Single     Spouse Name: N/A  . Number of Children: N/A  . Years of Education: N/A   Occupational History  . Not on file.   Social History Main Topics  . Smoking status: Current Every Day Smoker -- 0.50 packs/day  . Smokeless tobacco: Never Used  . Alcohol Use: Yes     Comment: 1 beer daily  . Drug Use: No  . Sexual Activity: Not on file   Other Topics Concern  . Not on file   Social History Narrative    Review of Systems: See history of present illness  Objective:   Filed Vitals:   03/30/15 1523  BP: 151/96  Pulse: 76  Temp: 98.5 F (36.9 C)  Resp: 16    Physical Exam  HENT:  Difficult to examine patient is right eye due to pain Left eye, round and reactive to light  Cardiovascular: Normal rate, regular rhythm and normal heart sounds.   Pulmonary/Chest: Effort normal and breath sounds normal.     Lab Results  Component Value Date   WBC 10.7* 03/14/2015   HGB 14.3 03/14/2015   HCT 42.0 03/14/2015   MCV 80.9 03/14/2015   PLT 336 03/14/2015   Lab Results  Component Value Date   CREATININE 0.99 03/14/2015   BUN 8 03/14/2015   NA 138 03/14/2015   K 3.3* 03/14/2015   CL 99 03/14/2015   CO2 26 03/14/2015  No results found for: HGBA1C Lipid Panel  No results found for: CHOL, TRIG, HDL, CHOLHDL, VLDL, LDLCALC     Assessment and plan:   Derrick Andrade was seen today for establish care.  Diagnoses and all orders for this visit:  Eye injury, right, subsequent encounter Explained to patient that it is important that he gets in to see an ophthalmologist ASAP to prevent permanent vision loss. I have given patient 3 pages of resources to call to see if he can get in on a payment plan. Explained to patient that [name] were willing to put him on a payment plan the first available appointment with May 12, I have advised patient to call and make appointment if possible.   Acute loss of vision, right See above  Blurred vision, left eye See above  Elevated  BP Blood pressure likely elevated due to pain in right eye and headaches. Patient is very stressed about follow-up appointment with no money Will continue to monitor follow-up visits  Return if symptoms worsen or fail to improve.        Holland Commons, NP-C [facility] [phone] 03/30/2015, 3:26 PM

## 2015-03-30 NOTE — Patient Instructions (Signed)
You will have to see a eye specialist soon to keep from losing your vision permanently. There is no other options at this time unless you go back to ER.

## 2015-04-15 LAB — CULTURE, FUNGUS WITHOUT SMEAR

## 2015-04-16 NOTE — ED Notes (Signed)
Final report of fungal culture negative. No further action required

## 2015-07-27 ENCOUNTER — Ambulatory Visit: Payer: Self-pay | Admitting: Internal Medicine

## 2015-08-09 ENCOUNTER — Ambulatory Visit: Payer: Self-pay | Attending: Internal Medicine | Admitting: Internal Medicine

## 2015-08-09 ENCOUNTER — Encounter: Payer: Self-pay | Admitting: Internal Medicine

## 2015-08-09 ENCOUNTER — Encounter (HOSPITAL_COMMUNITY): Payer: Self-pay | Admitting: Family Medicine

## 2015-08-09 ENCOUNTER — Emergency Department (HOSPITAL_COMMUNITY)
Admission: EM | Admit: 2015-08-09 | Discharge: 2015-08-09 | Disposition: A | Discharge status: Home / Self Care | Payer: Self-pay | Attending: Emergency Medicine | Admitting: Emergency Medicine

## 2015-08-09 VITALS — BP 160/88 | HR 80 | Temp 97.8°F | Resp 16 | Ht 73.0 in | Wt 149.0 lb

## 2015-08-09 DIAGNOSIS — Z720 Tobacco use: Secondary | ICD-10-CM | POA: Insufficient documentation

## 2015-08-09 DIAGNOSIS — Z79899 Other long term (current) drug therapy: Secondary | ICD-10-CM | POA: Insufficient documentation

## 2015-08-09 DIAGNOSIS — IMO0001 Reserved for inherently not codable concepts without codable children: Secondary | ICD-10-CM

## 2015-08-09 DIAGNOSIS — R030 Elevated blood-pressure reading, without diagnosis of hypertension: Secondary | ICD-10-CM | POA: Insufficient documentation

## 2015-08-09 DIAGNOSIS — Z8781 Personal history of (healed) traumatic fracture: Secondary | ICD-10-CM | POA: Insufficient documentation

## 2015-08-09 DIAGNOSIS — H5712 Ocular pain, left eye: Secondary | ICD-10-CM | POA: Insufficient documentation

## 2015-08-09 DIAGNOSIS — H5462 Unqualified visual loss, left eye, normal vision right eye: Secondary | ICD-10-CM | POA: Insufficient documentation

## 2015-08-09 DIAGNOSIS — F17210 Nicotine dependence, cigarettes, uncomplicated: Secondary | ICD-10-CM | POA: Insufficient documentation

## 2015-08-09 NOTE — Discharge Instructions (Signed)
Go see the eye doctor tomorrow at 8:30 in the morning

## 2015-08-09 NOTE — ED Notes (Signed)
MD at bedside. 

## 2015-08-09 NOTE — ED Provider Notes (Signed)
CSN: [id number]     Arrival date & time 08/09/15  1719 History   First MD Initiated Contact with Patient 08/09/15 1745     Chief Complaint  Patient presents with  . Eye Problem     (Consider location/radiation/quality/duration/timing/severity/associated sxs/prior Treatment) HPI Comments: Patient here complaining of decreased visual acuity 4 months. No change in symptoms today. Has been given resources for follow-up which has not performed. Denies any eye pain. No headache. No nausea vomiting. No focal neurological deficits. Symptoms progressively worse and nothing makes them better or worse. No treatment used prior to arrival  Patient is a 54 y.o. male presenting with eye problem. The history is provided by the patient.  Eye Problem   Past Medical History  Diagnosis Date  . Wrist fracture     left   Past Surgical History  Procedure Laterality Date  . Incise and drain abcess      "boil" on buttock  . Pars plana vitrectomy Right 03/14/2015    Procedure: Exploration Right Ruptured Globe, Vitrous Tap, Antibiotic Injection, PARS PLANA VITRECTOMY 25 GAUGE WITH LASER;  Surgeon: Carmela Rima, MD;  Location: [facility] OR;  Service: Ophthalmology;  Laterality: Right;   History reviewed. No pertinent family history. Social History  Substance Use Topics  . Smoking status: Current Every Day Smoker -- 0.50 packs/day  . Smokeless tobacco: Never Used  . Alcohol Use: Yes     Comment: 1 beer daily    Review of Systems  All other systems reviewed and are negative.     Allergies  Review of patient's allergies indicates no known allergies.  Home Medications   Prior to Admission medications   Medication Sig Start Date End Date Taking? Authorizing Provider  Carboxymethylcellul-Glycerin (CLEAR EYES FOR DRY EYES OP) Apply 1-2 drops to eye as needed (Red eyes).   Yes Historical Provider, MD  gatifloxacin (ZYMAXID) 0.5 % SOLN Place 1 drop into the right eye 4 (four) times daily. Patient not  taking: Reported on 08/09/2015 03/14/15   Carmela Rima, MD   BP 153/85 mmHg  Pulse 81  Temp(Src) 98.2 F (36.8 C) (Oral)  Resp 16  SpO2 95% Physical Exam  Constitutional: He is oriented to person, place, and time. He appears well-developed and well-nourished.  Non-toxic appearance. No distress.  HENT:  Head: Normocephalic and atraumatic.  Eyes: Conjunctivae, EOM and lids are normal. Pupils are equal, round, and reactive to light. No scleral icterus.  Right coronary with opacity noted. No light reflex. Left cornea clear without cloudiness. No exudate noted. No scleral injection. Extraocular muscles intact  Neck: Normal range of motion. Neck supple. No tracheal deviation present. No thyroid mass present.  Cardiovascular: Normal rate, regular rhythm and normal heart sounds.  Exam reveals no gallop.   No murmur heard. Pulmonary/Chest: Effort normal and breath sounds normal. No stridor. No respiratory distress. He has no decreased breath sounds. He has no wheezes. He has no rhonchi. He has no rales.  Abdominal: Soft. Normal appearance and bowel sounds are normal. He exhibits no distension. There is no tenderness. There is no rebound and no CVA tenderness.  Musculoskeletal: Normal range of motion. He exhibits no edema or tenderness.  Neurological: He is alert and oriented to person, place, and time. He has normal strength. No cranial nerve deficit or sensory deficit. GCS eye subscore is 4. GCS verbal subscore is 5. GCS motor subscore is 6.  Skin: Skin is warm and dry. No abrasion and no rash noted.  Psychiatric: He has a  normal mood and affect. His speech is normal and behavior is normal.  Nursing note and vitals reviewed.   ED Course  Procedures (including critical care time) Labs Review Labs Reviewed - No data to display  Imaging Review No results found. I have personally reviewed and evaluated these images and lab results as part of my medical decision-making.   EKG  Interpretation None      MDM   Final diagnoses:  None    Patient with progressive visual loss over the past 4 months. No changes current symptoms. Suspect this is peripheral issue. We'll contact ophthalmology arrange referral.    Lorre Nick, MD 08/09/15 [phone]
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
## 2015-08-09 NOTE — ED Notes (Signed)
Visual Acuity:  Right eye-blind Left-20/800, Resident MD aware.

## 2015-08-09 NOTE — ED Notes (Signed)
Pt here for loss of vision in left eye. 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 sts he sees a reflection in left eye. sts he woke up this am like that. sts also some drainage out of eye. sts similar to when he had problems with right eye a few months ago.

## 2015-08-09 NOTE — Progress Notes (Signed)
Patient complains of having some blurred vision in his left eye And trouble seeing Patient states he had surgery to his right eye about four months ago and Now experiencing the same thing in his left eye

## 2016-05-20 ENCOUNTER — Emergency Department (HOSPITAL_COMMUNITY): Payer: Self-pay

## 2016-05-20 ENCOUNTER — Emergency Department (HOSPITAL_COMMUNITY)
Admission: EM | Admit: 2016-05-20 | Discharge: 2016-05-20 | Disposition: A | Discharge status: Home / Self Care | Payer: Self-pay | Attending: Emergency Medicine | Admitting: Emergency Medicine

## 2016-05-20 ENCOUNTER — Encounter (HOSPITAL_COMMUNITY): Payer: Self-pay

## 2016-05-20 DIAGNOSIS — N430 Encysted hydrocele: Secondary | ICD-10-CM | POA: Insufficient documentation

## 2016-05-20 DIAGNOSIS — N5089 Other specified disorders of the male genital organs: Secondary | ICD-10-CM

## 2016-05-20 DIAGNOSIS — Z8781 Personal history of (healed) traumatic fracture: Secondary | ICD-10-CM | POA: Insufficient documentation

## 2016-05-20 DIAGNOSIS — N3000 Acute cystitis without hematuria: Secondary | ICD-10-CM | POA: Insufficient documentation

## 2016-05-20 DIAGNOSIS — F17200 Nicotine dependence, unspecified, uncomplicated: Secondary | ICD-10-CM | POA: Insufficient documentation

## 2016-05-20 LAB — URINALYSIS, ROUTINE W REFLEX MICROSCOPIC
BILIRUBIN URINE: NEGATIVE
Glucose, UA: NEGATIVE mg/dL
HGB URINE DIPSTICK: NEGATIVE
Ketones, ur: NEGATIVE mg/dL
NITRITE: POSITIVE — AB
PROTEIN: NEGATIVE mg/dL
Specific Gravity, Urine: 1.014 (ref 1.005–1.030)
pH: 5 (ref 5.0–8.0)

## 2016-05-20 LAB — URINE MICROSCOPIC-ADD ON

## 2016-05-20 MED ORDER — SULFAMETHOXAZOLE-TRIMETHOPRIM 800-160 MG PO TABS: 1.0000 | ORAL_TABLET | Freq: Two times a day (BID) | ORAL | Status: AC

## 2016-05-20 NOTE — ED Provider Notes (Signed)
CSN: [id number]     Arrival date & time 05/20/16  1002 History  By signing my name below, I, Essence Howell and Alyssa GroveMartin Green, attest that this documentation has been prepared under the direction and in the presence of Jaqwan Wieber, PA-C . Electronically Signed: Charline BillsEssence Howell, ED Scribe 05/20/2016 at 1:17 PM.   Chief Complaint  Patient presents with  . Groin Swelling   The history is provided by the patient. No language interpreter was used.   HPI Comments: Derrick Andrade is a 55 y.o. male who presents to the Emergency Department complaining of gradual onset swelling in the left testicle onset of 3-4 days ago. Pt denies injury. Pt reports aching pain exarcebated while rolling over in bed. Pt denies fever, chills, abdominal pain, pain with bowel movements, back pain, difficulty urinating, penile discharge, or any other complaints. Pt states he had a hx of similar symptoms in the right testicle that self resolved. Patient states he is monogamous with last sexual contact 2 weeks ago.    Past Medical History  Diagnosis Date  . Wrist fracture     left   Past Surgical History  Procedure Laterality Date  . Incise and drain abcess      "boil" on buttock  . Pars plana vitrectomy Right 03/14/2015    Procedure: Exploration Right Ruptured Globe, Vitrous Tap, Antibiotic Injection, PARS PLANA VITRECTOMY 25 GAUGE WITH LASER;  Surgeon: Carmela RimaNarendra Patel, MD;  [facility];  Service: Ophthalmology;  Laterality: Right;   No family history on file. Social History  Substance Use Topics  . Smoking status: Current Every Day Smoker -- 0.50 packs/day  . Smokeless tobacco: Never Used  . Alcohol Use: Yes     Comment: 1 beer daily    Review of Systems  Constitutional: Negative for fever and chills.  Gastrointestinal: Negative for nausea, vomiting and abdominal pain.  Genitourinary: Positive for scrotal swelling and testicular pain. Negative for dysuria, hematuria, flank pain, discharge, penile swelling and  difficulty urinating.  Neurological: Negative for numbness.  All other systems reviewed and are negative.     Allergies  Review of patient's allergies indicates no known allergies.  Home Medications   Prior to Admission medications   Medication Sig Start Date End Date Taking? Authorizing Provider  Carboxymethylcellul-Glycerin (CLEAR EYES FOR DRY EYES OP) Apply 1-2 drops to eye as needed (Red eyes).    Historical Provider, MD  gatifloxacin (ZYMAXID) 0.5 % SOLN Place 1 drop into the right eye 4 (four) times daily. Patient not taking: Reported on 08/09/2015 03/14/15   Carmela RimaNarendra Patel, MD  sulfamethoxazole-trimethoprim (BACTRIM DS,SEPTRA DS) 800-160 MG tablet Take 1 tablet by mouth 2 (two) times daily. 05/20/16 05/27/16  Carlyon Nolasco C Le Faulcon, PA-C   BP 127/92 mmHg  Pulse 91  Temp(Src) 99.2 F (37.3 C)  Resp 18  SpO2 100% Physical Exam  Constitutional: He appears well-developed and well-nourished. No distress.  HENT:  Head: Normocephalic and atraumatic.  Eyes: Conjunctivae and EOM are normal.  Neck: Neck supple.  Cardiovascular: Normal rate, regular rhythm and intact distal pulses.   Pulmonary/Chest: Effort normal. No respiratory distress.  Abdominal: Soft. There is no tenderness. There is no guarding.  Genitourinary: Cremasteric reflex is present.  Firmness and swelling to left scrotum. No tenderness to palpation. No penile lesions, swelling, or discharge. Neither testicle able to be definitively palpated. Scribe, Daphine DeutscherMartin, served as Biomedical engineerchaperone during the exam.  Musculoskeletal: Normal range of motion. He exhibits no edema or tenderness.  Lymphadenopathy:    He  has no cervical adenopathy.       Right: No inguinal adenopathy present.       Left: No inguinal adenopathy present.  Neurological: He is alert.  Skin: Skin is warm and dry. He is not diaphoretic.  Psychiatric: He has a normal mood and affect. His behavior is normal.  Nursing note and vitals reviewed.   ED Course  Procedures  (including critical care time) DIAGNOSTIC STUDIES: Oxygen Saturation is 100% on RA, normal by my interpretation.    COORDINATION OF CARE: 11:41 AM-Discussed treatment plan which includes [place] of scrotum and UA with pt at bedside and pt agreed to plan.     Labs Review Labs Reviewed  URINALYSIS, ROUTINE W REFLEX MICROSCOPIC (NOT AT [facility]) - Abnormal; Notable for the following:    APPearance CLOUDY (*)    Nitrite POSITIVE (*)    Leukocytes, UA SMALL (*)    All other components within normal limits  URINE MICROSCOPIC-ADD ON - Abnormal; Notable for the following:    Squamous Epithelial / LPF 0-5 (*)    Bacteria, UA FEW (*)    All other components within normal limits  URINE CULTURE  GC/CHLAMYDIA PROBE AMP ([place]) NOT AT [facility]    Imaging Review US Scrotum  05/20/2016  CLINICAL DATA:  For mass for 4 days with swelling. EXAM: SCROTAL ULTRASOUND DOPPLER ULTRASOUND OF THE TESTICLES TECHNIQUE: Complete ultrasound examination of the testicles, epididymis, and other scrotal structures was performed. Color and spectral Doppler ultrasound were also utilized to evaluate blood flow to the testicles. COMPARISON:  None. FINDINGS: Right testicle Measurements: 3.6 x 2.2 x 2.1 cm. No testicular mass. Testicular microlithiasis. Left testicle Measurements: 3.7 x 2.7 x 2.2 cm. No testicular mass. Linear echogenic area within the left testicle consistent with calcification. Right epididymis: Normal in size. 4 x 4 x 7 mm right epididymal cyst. Left epididymis:  Normal in size and appearance. Hydrocele: Large complex extratesticular fluid collection with multiple septations within the left scrotal sac. Varicocele:  None visualized. Pulsed Doppler interrogation of both testes demonstrates normal low resistance arterial and venous waveforms bilaterally. IMPRESSION: 1. Large complex extratesticular fluid collection with multiple septations within the left scrotal sac most consistent with a complex hydrocele which may be  secondary to prior trauma or infection. Urology consultation recommended. 2. No testicular torsion or testicular mass. Electronically Signed   By: Elige Ko   On: 05/20/2016 13:06   [place] Art/ven Flow Abd Pelv Doppler  05/20/2016  CLINICAL DATA:  For mass for 4 days with swelling. EXAM: SCROTAL ULTRASOUND DOPPLER ULTRASOUND OF THE TESTICLES TECHNIQUE: Complete ultrasound examination of the testicles, epididymis, and other scrotal structures was performed. Color and spectral Doppler ultrasound were also utilized to evaluate blood flow to the testicles. COMPARISON:  None. FINDINGS: Right testicle Measurements: 3.6 x 2.2 x 2.1 cm. No testicular mass. Testicular microlithiasis. Left testicle Measurements: 3.7 x 2.7 x 2.2 cm. No testicular mass. Linear echogenic area within the left testicle consistent with calcification. Right epididymis: Normal in size. 4 x 4 x 7 mm right epididymal cyst. Left epididymis:  Normal in size and appearance. Hydrocele: Large complex extratesticular fluid collection with multiple septations within the left scrotal sac. Varicocele:  None visualized. Pulsed Doppler interrogation of both testes demonstrates normal low resistance arterial and venous waveforms bilaterally. IMPRESSION: 1. Large complex extratesticular fluid collection with multiple septations within the left scrotal sac most consistent with a complex hydrocele which may be secondary to prior trauma or infection. Urology consultation recommended.  2. No testicular torsion or testicular mass. Electronically Signed   By: Elige Ko   On: 05/20/2016 13:06     EKG Interpretation None        MDM   Final diagnoses:  Scrotal swelling  Acute cystitis without hematuria  Encysted hydrocele    Derrick Andrade presents with scrotal swelling for the last 3-4 days.  Findings and plan of care discussed with Glynn Octave, MD. Dr. Manus Gunning personally evaluated and examined this patient.  Patient has no evidence of systemic  infection or other illness. Large cyst, possibly a hydrocele, noted on the left via ultrasound. UTI evident on UA. Urology consult and UTI treatment. Outpatient urology follow-up.  2:59 PM Spoke with a nurse from [facility], who stated she would have Dr. Patsi Sears look at the patient's record and ultrasound and decide whether or not the patient is to be seen in the ED or outpatient. Adds that the patient needs to be seen in the ED it may be after 5:00 PM. Marchelle Folks, PA from [facility], was able to assess the patient here in the ED. Recommends PVR and then discharge with Bactrim for 7-10 for his UTI, and then outpatient urology follow up. Pt voided 50 mL before bladder scan with 24 mL remaining. Return precautions discussed. Patient voice understanding of this information, agrees to the plan, and is comfortable with discharge.  I personally performed the services described in this documentation, which was scribed in my presence. The recorded information has been reviewed and is accurate.   Filed Vitals:   05/20/16 1031 05/20/16 1459  BP: 127/92 151/78  Pulse: 91 62  Temp: 99.2 F (37.3 C)   Resp: 18 18  SpO2: 100% 99%     Anselm Pancoast, PA-C 05/20/16 1600  Glynn Octave, MD 05/20/16 [phone]

## 2016-05-20 NOTE — ED Notes (Signed)
After voiding 50 mL of urine, bladder scan performed showing 24 mL of post void residual.

## 2016-05-20 NOTE — Discharge Instructions (Signed)
You have been seen today for scrotal swelling. There was an abnormality found on ultrasound called a hydrocele. Refer to the attached literature for further information. You also have an urinary tract infection, or UTI. This requires an antibiotic. Please take all of your antibiotics until finished!   You may develop abdominal discomfort or diarrhea from the antibiotic.  You may help offset this with probiotics which you can buy or get in yogurt. Do not eat or take the probiotics until 2 hours after your antibiotic. Follow-up with urology as soon as possible. The office should call to set up an appointment, however, if they do not call within the next 2 days, call the number provided to set up an appointment. Return to ED should symptoms worsen. May use naproxen or ibuprofen for pain.

## 2016-05-20 NOTE — ED Notes (Signed)
Patient complains of left testicle swelling x 4 days. Denies trauma, denies pain, no urinary symptoms

## 2016-05-20 NOTE — Consult Note (Signed)
Urology Consult   Physician requesting consult: Glynn Octave  Reason for consult: UTI and left hydrocele  History of Present Illness: Derrick Andrade is a 55 y.o. male with no significant PMH who presented to the ED today with c/o 4 days of left scrotal swelling and discomfort.  He states he had no swelling or pain prior to 4 days ago.  He rolled over in bed and noticed some pain and states the swelling was "just there the next morning".  He denies a history of voiding or storage urinary symptoms, hematuria, UTIs, STDs, urolithiasis, GU malignancy/trauma/surgery.  He is sexually active in a monogamous relationship.    In the ED a scrotal U/S revealed a large complex extratesticular fluid collection with multiple septations within the left scrotal sac consistent with a complex hydrocele; no torsion or testicular mass.  UA is nitrite positive with small leukocytes and few bacteria.  PVR is 24ml.    He is currently resting comfortably.  He denies F/C, HA, CP, SOB, N/V, diarrhea/constipation, and abdominal pain.   Past Medical History  Diagnosis Date  . Wrist fracture     left    Past Surgical History  Procedure Laterality Date  . Incise and drain abcess      "boil" on buttock  . Pars plana vitrectomy Right 03/14/2015    Procedure: Exploration Right Ruptured Globe, Vitrous Tap, Antibiotic Injection, PARS PLANA VITRECTOMY 25 GAUGE WITH LASER;  Surgeon: Carmela Rima, MD;  Location: [facility] OR;  Service: Ophthalmology;  Laterality: Right;    Current Hospital Medications:  Home Meds:    Medication List    ASK your doctor about these medications        CLEAR EYES FOR DRY EYES OP  Apply 1-2 drops to eye as needed (Red eyes).     gatifloxacin 0.5 % Soln  Commonly known as:  ZYMAXID  Place 1 drop into the right eye 4 (four) times daily.        Scheduled Meds: Continuous Infusions: PRN Meds:.  Allergies: No Known Allergies  No family history on file.  Social History:  reports  that he has been smoking.  He has never used smokeless tobacco. He reports that he drinks alcohol. He reports that he does not use illicit drugs.  ROS: A complete review of systems was performed.  All systems are negative except for pertinent findings as noted.  Physical Exam:  Vital signs in last 24 hours: Temp:  [99.2 F (37.3 C)] 99.2 F (37.3 C) (06/06 1031) Pulse Rate:  [62-91] 62 (06/06 1459) Resp:  [18] 18 (06/06 1459) BP: (127-151)/(78-92) 151/78 mmHg (06/06 1459) SpO2:  [99 %-100 %] 99 % (06/06 1459) Constitutional:  Alert and oriented, No acute distress Cardiovascular: Regular rate and rhythm Respiratory: Normal respiratory effort GI: Abdomen is soft, nontender, nondistended, no abdominal masses GU: circum penis with no discharge or skin breakdown; right testicle WNL; left testicle not palp; moderate sized firm hydrocele with minimal tenderness on left Lymphatic: No lymphadenopathy Neurologic: Grossly intact, no focal deficits Psychiatric: Normal mood and affect  Laboratory Data:  No results for input(s): WBC, HGB, HCT, PLT in the last 72 hours.  No results for input(s): NA, K, CL, GLUCOSE, BUN, CALCIUM, CREATININE in the last 72 hours.  Invalid input(s): CO3   Results for orders placed or performed during the hospital encounter of 05/20/16 (from the past 24 hour(s))  Urinalysis, Routine w reflex microscopic (not at [facility])     Status: Abnormal   Collection  Time: 05/20/16 10:30 AM  Result Value Ref Range   Color, Urine YELLOW YELLOW   APPearance CLOUDY (A) CLEAR   Specific Gravity, Urine 1.014 1.005 - 1.030   pH 5.0 5.0 - 8.0   Glucose, UA NEGATIVE NEGATIVE mg/dL   Hgb urine dipstick NEGATIVE NEGATIVE   Bilirubin Urine NEGATIVE NEGATIVE   Ketones, ur NEGATIVE NEGATIVE mg/dL   Protein, ur NEGATIVE NEGATIVE mg/dL   Nitrite POSITIVE (A) NEGATIVE   Leukocytes, UA SMALL (A) NEGATIVE  Urine microscopic-add on     Status: Abnormal   Collection Time: 05/20/16 10:30 AM   Result Value Ref Range   Squamous Epithelial / LPF 0-5 (A) NONE SEEN   WBC, UA 6-30 0 - 5 WBC/hpf   RBC / HPF 0-5 0 - 5 RBC/hpf   Bacteria, UA FEW (A) NONE SEEN   Urine-Other AMORPHOUS URATES/PHOSPHATES    No results found for this or any previous visit (from the past 240 hour(s)).  Renal Function: No results for input(s): CREATININE in the last 168 hours. CrCl cannot be calculated (Unknown ideal weight.).  Radiologic Imaging: US Scrotum  05/20/2016  CLINICAL DATA:  For mass for 4 days with swelling. EXAM: SCROTAL ULTRASOUND DOPPLER ULTRASOUND OF THE TESTICLES TECHNIQUE: Complete ultrasound examination of the testicles, epididymis, and other scrotal structures was performed. Color and spectral Doppler ultrasound were also utilized to evaluate blood flow to the testicles. COMPARISON:  None. FINDINGS: Right testicle Measurements: 3.6 x 2.2 x 2.1 cm. No testicular mass. Testicular microlithiasis. Left testicle Measurements: 3.7 x 2.7 x 2.2 cm. No testicular mass. Linear echogenic area within the left testicle consistent with calcification. Right epididymis: Normal in size. 4 x 4 x 7 mm right epididymal cyst. Left epididymis:  Normal in size and appearance. Hydrocele: Large complex extratesticular fluid collection with multiple septations within the left scrotal sac. Varicocele:  None visualized. Pulsed Doppler interrogation of both testes demonstrates normal low resistance arterial and venous waveforms bilaterally. IMPRESSION: 1. Large complex extratesticular fluid collection with multiple septations within the left scrotal sac most consistent with a complex hydrocele which may be secondary to prior trauma or infection. Urology consultation recommended. 2. No testicular torsion or testicular mass. Electronically Signed   By: Elige Ko   On: 05/20/2016 13:06   [place] Art/ven Flow Abd Pelv Doppler  05/20/2016  CLINICAL DATA:  For mass for 4 days with swelling. EXAM: SCROTAL ULTRASOUND DOPPLER ULTRASOUND  OF THE TESTICLES TECHNIQUE: Complete ultrasound examination of the testicles, epididymis, and other scrotal structures was performed. Color and spectral Doppler ultrasound were also utilized to evaluate blood flow to the testicles. COMPARISON:  None. FINDINGS: Right testicle Measurements: 3.6 x 2.2 x 2.1 cm. No testicular mass. Testicular microlithiasis. Left testicle Measurements: 3.7 x 2.7 x 2.2 cm. No testicular mass. Linear echogenic area within the left testicle consistent with calcification. Right epididymis: Normal in size. 4 x 4 x 7 mm right epididymal cyst. Left epididymis:  Normal in size and appearance. Hydrocele: Large complex extratesticular fluid collection with multiple septations within the left scrotal sac. Varicocele:  None visualized. Pulsed Doppler interrogation of both testes demonstrates normal low resistance arterial and venous waveforms bilaterally. IMPRESSION: 1. Large complex extratesticular fluid collection with multiple septations within the left scrotal sac most consistent with a complex hydrocele which may be secondary to prior trauma or infection. Urology consultation recommended. 2. No testicular torsion or testicular mass. Electronically Signed   By: Elige Ko   On: 05/20/2016 13:06  Impression/Recommendation  Left hydrocele--benign; can use advil or tylenol as well as ice packs prn for discomfort  UTI and possible left epididymitis--urine sent for culture.  GC/Chlamydia pending.  Treat with 10 days of Bactrim and f/u culture results.  F/u at [facility] urology after completion of ABx to recheck urine and ensure resolution.   Carolin Quang 05/20/2016, 3:45 PM

## 2016-05-20 NOTE — ED Notes (Signed)
Declined W/C at D/C and was escorted to lobby by RN. 

## 2016-05-21 LAB — GC/CHLAMYDIA PROBE AMP (~~LOC~~) NOT AT ARMC
Chlamydia: NEGATIVE
NEISSERIA GONORRHEA: NEGATIVE

## 2016-05-23 LAB — URINE CULTURE

## 2016-05-24 ENCOUNTER — Telehealth (HOSPITAL_BASED_OUTPATIENT_CLINIC_OR_DEPARTMENT_OTHER): Payer: Self-pay

## 2016-05-24 NOTE — Telephone Encounter (Signed)
Post ED Visit - Positive Culture Follow-up  Culture report reviewed by antimicrobial stewardship pharmacist:  []  Derrick Andrade, Pharm.D. []  Derrick Andrade, Pharm.D., BCPS []  Derrick Andrade, Pharm.D. []  Derrick Andrade, Pharm.D., BCPS []  Derrick Andrade, [place] BCPS, AAHIVP []  Derrick Andrade, Pharm.D., BCPS, AAHIVP [x]  Derrick Andrade, [address] []  Derrick Andrade, [address]Ph[address]  Positive urine culture Treated with Sulfamethoxazole-Trimethoprim, organism sensitive to the same and no further patient follow-up is required at this time.  Derrick Andrade, Derrick Andrade 05/24/2016, 9:34 AM

## 2019-09-06 ENCOUNTER — Inpatient Hospital Stay (HOSPITAL_COMMUNITY)
Admission: EM | Admit: 2019-09-06 | Discharge: 2019-09-09 | DRG: 580 | Disposition: A | Discharge status: Home / Self Care | Payer: Self-pay | Attending: Physician Assistant | Admitting: Physician Assistant

## 2019-09-06 ENCOUNTER — Observation Stay (HOSPITAL_COMMUNITY): Payer: Self-pay | Admitting: Anesthesiology

## 2019-09-06 ENCOUNTER — Encounter (HOSPITAL_COMMUNITY): Admission: EM | Disposition: A | Discharge status: Home / Self Care | Payer: Self-pay | Source: Home / Self Care

## 2019-09-06 ENCOUNTER — Emergency Department (HOSPITAL_COMMUNITY): Admission: EM | Admit: 2019-09-06 | Payer: Self-pay | Source: Home / Self Care

## 2019-09-06 ENCOUNTER — Emergency Department (HOSPITAL_COMMUNITY): Payer: Self-pay

## 2019-09-06 ENCOUNTER — Encounter (HOSPITAL_COMMUNITY): Payer: Self-pay | Admitting: Orthopedic Surgery

## 2019-09-06 DIAGNOSIS — L02215 Cutaneous abscess of perineum: Principal | ICD-10-CM | POA: Diagnosis present

## 2019-09-06 DIAGNOSIS — K611 Rectal abscess: Secondary | ICD-10-CM

## 2019-09-06 DIAGNOSIS — R64 Cachexia: Secondary | ICD-10-CM | POA: Diagnosis present

## 2019-09-06 DIAGNOSIS — A419 Sepsis, unspecified organism: Secondary | ICD-10-CM

## 2019-09-06 DIAGNOSIS — F17210 Nicotine dependence, cigarettes, uncomplicated: Secondary | ICD-10-CM | POA: Diagnosis present

## 2019-09-06 DIAGNOSIS — Z681 Body mass index (BMI) 19 or less, adult: Secondary | ICD-10-CM

## 2019-09-06 DIAGNOSIS — Z20828 Contact with and (suspected) exposure to other viral communicable diseases: Secondary | ICD-10-CM | POA: Diagnosis present

## 2019-09-06 HISTORY — DX: Rectal abscess: K61.1

## 2019-09-06 HISTORY — PX: INCISION AND DRAINAGE PERIRECTAL ABSCESS: SHX1804

## 2019-09-06 LAB — COMPREHENSIVE METABOLIC PANEL
ALT: 9 U/L (ref 0–44)
AST: 20 U/L (ref 15–41)
Albumin: 2.3 g/dL — ABNORMAL LOW (ref 3.5–5.0)
Alkaline Phosphatase: 65 U/L (ref 38–126)
Anion gap: 12 (ref 5–15)
BUN: 8 mg/dL (ref 6–20)
CO2: 27 mmol/L (ref 22–32)
Calcium: 8.4 mg/dL — ABNORMAL LOW (ref 8.9–10.3)
Chloride: 94 mmol/L — ABNORMAL LOW (ref 98–111)
Creatinine, Ser: 1.3 mg/dL — ABNORMAL HIGH (ref 0.61–1.24)
GFR calc Af Amer: >60 mL/min (ref >60)
GFR calc non Af Amer: >60 mL/min (ref >60)
Glucose, Bld: 217 mg/dL — ABNORMAL HIGH (ref 70–99)
Potassium: 3 mmol/L — ABNORMAL LOW (ref 3.5–5.1)
Sodium: 133 mmol/L — ABNORMAL LOW (ref 135–145)
Total Bilirubin: 1.1 mg/dL (ref 0.3–1.2)
Total Protein: 7.1 g/dL (ref 6.5–8.1)

## 2019-09-06 LAB — CBC WITH DIFFERENTIAL/PLATELET
Abs Immature Granulocytes: 0 10*3/uL (ref 0.00–0.07)
Basophils Absolute: 0 10*3/uL (ref 0.0–0.1)
Basophils Relative: 0 %
Eosinophils Absolute: 0 10*3/uL (ref 0.0–0.5)
Eosinophils Relative: 0 %
HCT: 27.1 % — ABNORMAL LOW (ref 39.0–52.0)
Hemoglobin: 8.9 g/dL — ABNORMAL LOW (ref 13.0–17.0)
Immature Granulocytes: 0 %
Lymphocytes Relative: 4 %
Lymphs Abs: 1.4 10*3/uL (ref 0.7–4.0)
MCH: 26.6 pg (ref 26.0–34.0)
MCHC: 32.8 g/dL (ref 30.0–36.0)
MCV: 81.1 fL (ref 80.0–100.0)
Monocytes Absolute: 1 10*3/uL (ref 0.1–1.0)
Monocytes Relative: 3 %
Neutro Abs: 31.4 10*3/uL — ABNORMAL HIGH (ref 1.7–7.7)
Neutrophils Relative %: 93 %
Platelets: 448 10*3/uL — ABNORMAL HIGH (ref 150–400)
RBC: 3.34 MIL/uL — ABNORMAL LOW (ref 4.22–5.81)
RDW: 13.6 % (ref 11.5–15.5)
WBC: 33.8 10*3/uL — ABNORMAL HIGH (ref 4.0–10.5)
nRBC: 0 % (ref 0.0–0.2)
nRBC: 0 /100 WBC

## 2019-09-06 LAB — SARS CORONAVIRUS 2 BY RT PCR (HOSPITAL ORDER, PERFORMED IN ~~LOC~~ HOSPITAL LAB): SARS Coronavirus 2: NEGATIVE

## 2019-09-06 LAB — APTT: aPTT: 36 seconds (ref 24–36)

## 2019-09-06 LAB — PROTIME-INR
INR: 1.2 (ref 0.8–1.2)
Prothrombin Time: 15.4 seconds — ABNORMAL HIGH (ref 11.4–15.2)

## 2019-09-06 LAB — GLUCOSE, CAPILLARY
Glucose-Capillary: 126 mg/dL — ABNORMAL HIGH (ref 70–99)
Glucose-Capillary: 116 mg/dL — ABNORMAL HIGH (ref 70–99)

## 2019-09-06 LAB — MAGNESIUM: Magnesium: 1.2 mg/dL — ABNORMAL LOW (ref 1.7–2.4)

## 2019-09-06 LAB — LACTIC ACID, PLASMA: Lactic Acid, Venous: 1.8 mmol/L (ref 0.5–1.9)

## 2019-09-06 SURGERY — INCISION AND DRAINAGE, ABSCESS, PERIRECTAL
Anesthesia: General | Site: Perineum

## 2019-09-06 MED ORDER — FENTANYL CITRATE (PF) 250 MCG/5ML IJ SOLN
INTRAMUSCULAR | Status: AC
  Filled 2019-09-06: qty 5

## 2019-09-06 MED ORDER — SIMETHICONE 80 MG PO CHEW
40.0000 mg | CHEWABLE_TABLET | Freq: Four times a day (QID) | ORAL | Status: DC | PRN
  Filled 2019-09-06: qty 1

## 2019-09-06 MED ORDER — OXYCODONE HCL 5 MG PO TABS
5.0000 mg | ORAL_TABLET | ORAL | Status: DC | PRN
  Administered 2019-09-07: 5 mg via ORAL
  Filled 2019-09-06: qty 1

## 2019-09-06 MED ORDER — PROMETHAZINE HCL 25 MG/ML IJ SOLN: 6.2500 mg | INTRAMUSCULAR | Status: DC | PRN

## 2019-09-06 MED ORDER — POTASSIUM CHLORIDE CRYS ER 20 MEQ PO TBCR
40.0000 meq | EXTENDED_RELEASE_TABLET | Freq: Once | ORAL | Status: AC
  Administered 2019-09-06: 40 meq via ORAL
  Filled 2019-09-06: qty 2

## 2019-09-06 MED ORDER — ROCURONIUM BROMIDE 10 MG/ML (PF) SYRINGE
PREFILLED_SYRINGE | INTRAVENOUS | Status: AC
  Filled 2019-09-06: qty 10

## 2019-09-06 MED ORDER — FENTANYL CITRATE (PF) 250 MCG/5ML IJ SOLN
INTRAMUSCULAR | Status: DC | PRN
  Administered 2019-09-06: 50 ug via INTRAVENOUS

## 2019-09-06 MED ORDER — ACETAMINOPHEN 650 MG RE SUPP: 650.0000 mg | Freq: Four times a day (QID) | RECTAL | Status: DC | PRN

## 2019-09-06 MED ORDER — HYDROMORPHONE HCL 1 MG/ML IJ SOLN
1.0000 mg | INTRAMUSCULAR | Status: DC | PRN
  Administered 2019-09-06 (×2): 1 mg via INTRAVENOUS
  Filled 2019-09-06 (×2): qty 1

## 2019-09-06 MED ORDER — VANCOMYCIN HCL IN DEXTROSE 1-5 GM/200ML-% IV SOLN
1000.0000 mg | INTRAVENOUS | Status: DC
  Administered 2019-09-07 – 2019-09-08 (×2): 1000 mg via INTRAVENOUS
  Filled 2019-09-06 (×3): qty 200

## 2019-09-06 MED ORDER — KETOROLAC TROMETHAMINE 30 MG/ML IJ SOLN
INTRAMUSCULAR | Status: AC
  Filled 2019-09-06: qty 1

## 2019-09-06 MED ORDER — 0.9 % SODIUM CHLORIDE (POUR BTL) OPTIME
TOPICAL | Status: DC | PRN
  Administered 2019-09-06: 1000 mL

## 2019-09-06 MED ORDER — SUCCINYLCHOLINE CHLORIDE 200 MG/10ML IV SOSY
PREFILLED_SYRINGE | INTRAVENOUS | Status: AC
  Filled 2019-09-06: qty 10

## 2019-09-06 MED ORDER — ACETAMINOPHEN 325 MG PO TABS: 650.0000 mg | ORAL_TABLET | Freq: Four times a day (QID) | ORAL | Status: DC | PRN

## 2019-09-06 MED ORDER — METRONIDAZOLE IN NACL 5-0.79 MG/ML-% IV SOLN
500.0000 mg | Freq: Once | INTRAVENOUS | Status: AC
  Administered 2019-09-06: 500 mg via INTRAVENOUS
  Filled 2019-09-06: qty 100

## 2019-09-06 MED ORDER — POTASSIUM CHLORIDE IN NACL 40-0.9 MEQ/L-% IV SOLN
INTRAVENOUS | Status: DC
  Administered 2019-09-07 (×2): 125 mL/h via INTRAVENOUS
  Administered 2019-09-07: 50 mL/h via INTRAVENOUS
  Filled 2019-09-06 (×5): qty 1000

## 2019-09-06 MED ORDER — POLYETHYLENE GLYCOL 3350 17 G PO PACK: 17.0000 g | PACK | Freq: Every day | ORAL | Status: DC | PRN

## 2019-09-06 MED ORDER — OXYCODONE HCL 5 MG PO TABS: 5.0000 mg | ORAL_TABLET | Freq: Once | ORAL | Status: DC | PRN

## 2019-09-06 MED ORDER — DOCUSATE SODIUM 100 MG PO CAPS
100.0000 mg | ORAL_CAPSULE | Freq: Two times a day (BID) | ORAL | Status: DC
  Administered 2019-09-07 – 2019-09-09 (×5): 100 mg via ORAL
  Filled 2019-09-06 (×5): qty 1

## 2019-09-06 MED ORDER — VANCOMYCIN HCL IN DEXTROSE 1-5 GM/200ML-% IV SOLN: 1000.0000 mg | Freq: Once | INTRAVENOUS | Status: DC

## 2019-09-06 MED ORDER — SODIUM CHLORIDE 0.9 % IV SOLN
2.0000 g | Freq: Two times a day (BID) | INTRAVENOUS | Status: DC
  Administered 2019-09-07 – 2019-09-08 (×5): 2 g via INTRAVENOUS
  Filled 2019-09-06 (×8): qty 2

## 2019-09-06 MED ORDER — METOPROLOL TARTRATE 5 MG/5ML IV SOLN: 5.0000 mg | Freq: Four times a day (QID) | INTRAVENOUS | Status: DC | PRN

## 2019-09-06 MED ORDER — DEXAMETHASONE SODIUM PHOSPHATE 10 MG/ML IJ SOLN
INTRAMUSCULAR | Status: DC | PRN
  Administered 2019-09-06: 5 mg via INTRAVENOUS

## 2019-09-06 MED ORDER — BUPIVACAINE-EPINEPHRINE 0.25% -1:200000 IJ SOLN
INTRAMUSCULAR | Status: AC
  Filled 2019-09-06: qty 1

## 2019-09-06 MED ORDER — ONDANSETRON HCL 4 MG/2ML IJ SOLN: 4.0000 mg | Freq: Four times a day (QID) | INTRAMUSCULAR | Status: DC | PRN

## 2019-09-06 MED ORDER — PANTOPRAZOLE SODIUM 40 MG IV SOLR
40.0000 mg | Freq: Every day | INTRAVENOUS | Status: DC
  Administered 2019-09-07 – 2019-09-08 (×2): 40 mg via INTRAVENOUS
  Filled 2019-09-06 (×2): qty 40

## 2019-09-06 MED ORDER — DIPHENHYDRAMINE HCL 50 MG/ML IJ SOLN: 25.0000 mg | Freq: Four times a day (QID) | INTRAMUSCULAR | Status: DC | PRN

## 2019-09-06 MED ORDER — SODIUM CHLORIDE 0.9 % IV SOLN
2.0000 g | Freq: Once | INTRAVENOUS | Status: AC
  Administered 2019-09-06: 2 g via INTRAVENOUS
  Filled 2019-09-06: qty 2

## 2019-09-06 MED ORDER — LIDOCAINE HCL (CARDIAC) PF 100 MG/5ML IV SOSY
PREFILLED_SYRINGE | INTRAVENOUS | Status: DC | PRN
  Administered 2019-09-06: 60 mg via INTRATRACHEAL

## 2019-09-06 MED ORDER — LACTATED RINGERS IV SOLN
INTRAVENOUS | Status: DC
  Administered 2019-09-06: 19:00:00 via INTRAVENOUS

## 2019-09-06 MED ORDER — LIDOCAINE-EPINEPHRINE (PF) 2 %-1:200000 IJ SOLN
10.0000 mL | Freq: Once | INTRAMUSCULAR | Status: DC
  Filled 2019-09-06: qty 20

## 2019-09-06 MED ORDER — MAGNESIUM SULFATE 2 GM/50ML IV SOLN
2.0000 g | Freq: Once | INTRAVENOUS | Status: AC
  Administered 2019-09-06: 2 g via INTRAVENOUS
  Filled 2019-09-06: qty 50

## 2019-09-06 MED ORDER — KETOROLAC TROMETHAMINE 30 MG/ML IJ SOLN
30.0000 mg | Freq: Once | INTRAMUSCULAR | Status: AC
  Administered 2019-09-06: 30 mg via INTRAVENOUS

## 2019-09-06 MED ORDER — HYDROMORPHONE HCL 1 MG/ML IJ SOLN: 0.2500 mg | INTRAMUSCULAR | Status: DC | PRN

## 2019-09-06 MED ORDER — VANCOMYCIN HCL 10 G IV SOLR
1500.0000 mg | Freq: Once | INTRAVENOUS | Status: AC
  Administered 2019-09-06: 1500 mg via INTRAVENOUS
  Filled 2019-09-06: qty 1500

## 2019-09-06 MED ORDER — SODIUM CHLORIDE 0.9 % IV BOLUS
500.0000 mL | Freq: Once | INTRAVENOUS | Status: AC
  Administered 2019-09-06: 500 mL via INTRAVENOUS

## 2019-09-06 MED ORDER — PROPOFOL 10 MG/ML IV BOLUS
INTRAVENOUS | Status: DC | PRN
  Administered 2019-09-06: 200 mg via INTRAVENOUS

## 2019-09-06 MED ORDER — INSULIN ASPART 100 UNIT/ML ~~LOC~~ SOLN
0.0000 [IU] | SUBCUTANEOUS | Status: DC
  Administered 2019-09-07: 3 [IU] via SUBCUTANEOUS
  Administered 2019-09-07 (×2): 5 [IU] via SUBCUTANEOUS
  Administered 2019-09-07: 3 [IU] via SUBCUTANEOUS
  Administered 2019-09-07: 2 [IU] via SUBCUTANEOUS
  Administered 2019-09-08 (×2): 3 [IU] via SUBCUTANEOUS

## 2019-09-06 MED ORDER — ONDANSETRON HCL 4 MG/2ML IJ SOLN
INTRAMUSCULAR | Status: DC | PRN
  Administered 2019-09-06: 4 mg via INTRAVENOUS

## 2019-09-06 MED ORDER — FENTANYL CITRATE (PF) 100 MCG/2ML IJ SOLN
12.5000 ug | Freq: Once | INTRAMUSCULAR | Status: AC
  Administered 2019-09-06: 13:00:00 12.5 ug via INTRAVENOUS
  Filled 2019-09-06: qty 2

## 2019-09-06 MED ORDER — BUPIVACAINE-EPINEPHRINE (PF) 0.25% -1:200000 IJ SOLN
INTRAMUSCULAR | Status: DC | PRN
  Administered 2019-09-06: 10 mL

## 2019-09-06 MED ORDER — MIDAZOLAM HCL 2 MG/2ML IJ SOLN
INTRAMUSCULAR | Status: DC | PRN
  Administered 2019-09-06: 2 mg via INTRAVENOUS

## 2019-09-06 MED ORDER — FENTANYL CITRATE (PF) 100 MCG/2ML IJ SOLN
50.0000 ug | Freq: Once | INTRAMUSCULAR | Status: AC
  Administered 2019-09-06: 50 ug via INTRAVENOUS
  Filled 2019-09-06: qty 2

## 2019-09-06 MED ORDER — ENOXAPARIN SODIUM 40 MG/0.4ML ~~LOC~~ SOLN
40.0000 mg | SUBCUTANEOUS | Status: DC
  Administered 2019-09-07 – 2019-09-09 (×3): 40 mg via SUBCUTANEOUS
  Filled 2019-09-06 (×3): qty 0.4

## 2019-09-06 MED ORDER — ACETAMINOPHEN 10 MG/ML IV SOLN: 1000.0000 mg | Freq: Once | INTRAVENOUS | Status: DC | PRN

## 2019-09-06 MED ORDER — OXYCODONE HCL 5 MG/5ML PO SOLN: 5.0000 mg | Freq: Once | ORAL | Status: DC | PRN

## 2019-09-06 MED ORDER — ONDANSETRON 4 MG PO TBDP: 4.0000 mg | ORAL_TABLET | Freq: Four times a day (QID) | ORAL | Status: DC | PRN

## 2019-09-06 MED ORDER — DIPHENHYDRAMINE HCL 25 MG PO CAPS: 25.0000 mg | ORAL_CAPSULE | Freq: Four times a day (QID) | ORAL | Status: DC | PRN

## 2019-09-06 MED ORDER — MIDAZOLAM HCL 2 MG/2ML IJ SOLN
INTRAMUSCULAR | Status: AC
  Filled 2019-09-06: qty 2

## 2019-09-06 MED ORDER — IOHEXOL 300 MG/ML  SOLN
100.0000 mL | Freq: Once | INTRAMUSCULAR | Status: AC | PRN
  Administered 2019-09-06: 100 mL via INTRAVENOUS

## 2019-09-06 MED ORDER — CHLORHEXIDINE GLUCONATE CLOTH 2 % EX PADS: 6.0000 | MEDICATED_PAD | Freq: Once | CUTANEOUS | Status: DC

## 2019-09-06 MED ORDER — METHOCARBAMOL 500 MG PO TABS: 500.0000 mg | ORAL_TABLET | Freq: Four times a day (QID) | ORAL | Status: DC | PRN

## 2019-09-06 SURGICAL SUPPLY — 35 items
APL PRP STRL LF DISP 70% ISPRP (MISCELLANEOUS)
BLADE SURG 10 STRL SS (BLADE) ×1 IMPLANT
BLADE SURG 11 STRL SS (BLADE) ×1 IMPLANT
BLADE SURG 15 STRL LF DISP TIS (BLADE) ×1 IMPLANT
BLADE SURG 15 STRL SS (BLADE) ×3
CANISTER SUCT 3000ML PPV (MISCELLANEOUS) ×1 IMPLANT
CHLORAPREP W/TINT 26 (MISCELLANEOUS) IMPLANT
COVER SURGICAL LIGHT HANDLE (MISCELLANEOUS) ×3 IMPLANT
COVER WAND RF STERILE (DRAPES) ×3 IMPLANT
DRAIN PENROSE 1/4X12 LTX STRL (WOUND CARE) ×2 IMPLANT
DRSG PAD ABDOMINAL 8X10 ST (GAUZE/BANDAGES/DRESSINGS) ×2 IMPLANT
ELECT CAUTERY BLADE 6.4 (BLADE) ×3 IMPLANT
ELECT REM PT RETURN 9FT ADLT (ELECTROSURGICAL) ×3
ELECTRODE REM PT RTRN 9FT ADLT (ELECTROSURGICAL) ×1 IMPLANT
GAUZE SPONGE 4X4 12PLY STRL (GAUZE/BANDAGES/DRESSINGS) ×2 IMPLANT
GLOVE BIO SURGEON STRL SZ 6 (GLOVE) ×3 IMPLANT
GLOVE INDICATOR 6.5 STRL GRN (GLOVE) ×3 IMPLANT
GOWN STRL REUS W/ TWL LRG LVL3 (GOWN DISPOSABLE) ×2 IMPLANT
GOWN STRL REUS W/TWL LRG LVL3 (GOWN DISPOSABLE) ×6
KIT BASIN OR (CUSTOM PROCEDURE TRAY) ×3 IMPLANT
KIT TURNOVER KIT B (KITS) ×3 IMPLANT
NS IRRIG 1000ML POUR BTL (IV SOLUTION) ×3 IMPLANT
PACK LITHOTOMY IV (CUSTOM PROCEDURE TRAY) ×3 IMPLANT
PAD ARMBOARD 7.5X6 YLW CONV (MISCELLANEOUS) ×3 IMPLANT
PENCIL SMOKE EVACUATOR (MISCELLANEOUS) ×3 IMPLANT
SPONGE LAP 18X18 RF (DISPOSABLE) ×3 IMPLANT
SUT ETHILON 2 0 FS 18 (SUTURE) ×2 IMPLANT
SWAB COLLECTION DEVICE MRSA (MISCELLANEOUS) ×2 IMPLANT
SWAB CULTURE ESWAB REG 1ML (MISCELLANEOUS) ×2 IMPLANT
TOWEL GREEN STERILE (TOWEL DISPOSABLE) ×3 IMPLANT
TOWEL GREEN STERILE FF (TOWEL DISPOSABLE) ×3 IMPLANT
TUBE CONNECTING 12'X1/4 (SUCTIONS) ×1
TUBE CONNECTING 12X1/4 (SUCTIONS) ×2 IMPLANT
UNDERPAD 30X30 (UNDERPADS AND DIAPERS) ×3 IMPLANT
YANKAUER SUCT BULB TIP NO VENT (SUCTIONS) ×3 IMPLANT

## 2019-09-06 NOTE — Progress Notes (Signed)
Pharmacy Antibiotic Note  Derrick Andrade is a 58 y.o. male admitted on 09/06/2019 with infection of unknown source.  Pharmacy has been consulted for vancomycin/cefepime dosing. Afebrile, WBC 33.8. SCr 1.3 on admit.  Plan: Cefepime 2g IV x 1; then 2g IV q12h Vancomycin 1500mg  IV x1; then Vancomycin 1000 mg IV Q 24 hrs. Goal AUC 400-550. Expected AUC: 514 SCr used: 1.3 Flagyl 500mg  IV x 1 per EDP - f/u if to continue Monitor clinical progress, c/s, renal function F/u de-escalation plan/LOT, vancomycin levels as indicated   Weight: 137 lb 5.6 oz (62.3 kg)  Temp (24hrs), Avg:99 F (37.2 C), Min:99 F (37.2 C), Max:99 F (37.2 C)  Recent Labs  Lab 09/06/19 1031  WBC 33.8*    CrCl cannot be calculated (Patient's most recent lab result is older than the maximum 21 days allowed.).    No Known Allergies  Antimicrobials this admission: 9/22 vancomycin >>  9/22 cefepime >>  9/22 flagyl x 1  Dose adjustments this admission:   Microbiology results:   Elicia Lamp, PharmD, BCPS Clinical Pharmacist 09/06/2019 11:03 AM

## 2019-09-06 NOTE — Op Note (Signed)
Operative Note  Derrick Andrade  [id number]  [id number]  09/06/2019   Surgeon: Vikki Ports A ConnorMD  Assistant: OR staff  Procedure performed: incision and debridement of perineal abscess  Preop diagnosis: perineal abscess Post-op diagnosis/intraop findings: same, 300+ mL pus evacuated with cavity extending anteriorly towards the pubis and posteriorly along left side of rectum, crossing midline at the perineal body. Tissues in wound base appeared viable.   Specimens: cultures Retained items: penrose drain and kerlix packing EBL: minimal cc Complications: none  Description of procedure: After obtaining informed consent the patient was taken to the operating room and placed supine on operating room table wheregeneral LMA anesthesia was initiated, SCDs applied, and a formal timeout was performed.  He is on standing antibiotics.  He is placed in the dorsal lithotomy position with all pressure points appropriately padded.  The perineum was prepped and draped usual sterile fashion.  Fluctuance was present spanning from the left side of the perianal field all the way anteriorly into the left groin just posterior to the scrotum up to the level of the pubis.  A transverse incision was made at the level of the perineal body left of midline and copious purulent fluid was evacuated, more than 300 mL.  This fluid was cultured.  The incision was extended and the abscess cavity gently digitally probed to evacuate all loculations. The tissue within the abscess cavity appeared viable. The abscess cavity extends, by palpation, up to the pubic bone and inferiorly into the soft tissues in the left perianal field, as well as across midline at the level of the perineal body. A counter incision was made in the skin adjacent to the scrotum and the penrose was brought through this and sutured to itself with a 2-0 nylon. The wound was then packed with a saline-moistened kerlix. Dry dressings were applied.  The patient  was then awakened, extubated and taken to PACU in stable condition.   All counts were correct at the completion of the case.

## 2019-09-06 NOTE — H&P (View-Only) (Signed)
Reason for Consult: Abscess  Referring Physician: Dr. Juliann Mule is an 58 y.o. male.  HPI: Pt states he has an abscess/boil on his buttocks, that has been there for about 8 days. He states he had another one in the same place 8 years ago as well. It was surgically I&D. This was also the last time he has seen a provider. He states he has not been diagnosed with any medical problems, and does not take any medications besides a Tylenol occasionally. Pt states he has no allergies. Yesterday evening was the last time he has eaten. He keeps asking for water and is angry that no one has been in to see him.   Past Medical History:  Diagnosis Date   Wrist fracture    left    Past Surgical History:  Procedure Laterality Date   INCISE AND DRAIN ABCESS     "boil" on buttock   PARS PLANA VITRECTOMY Right 03/14/2015   Procedure: Exploration Right Ruptured Globe, Vitrous Tap, Antibiotic Injection, PARS PLANA VITRECTOMY 25 GAUGE WITH LASER;  Surgeon: Carmela Rima, MD;  Location: [facility] OR;  Service: Ophthalmology;  Laterality: Right;    No family history on file.  Social History:  reports that he has been smoking. He has been smoking about 0.50 packs per day. He has never used smokeless tobacco. He reports current alcohol use. He reports that he does not use drugs.  Allergies: No Known Allergies  Medications: I have reviewed the patient's current medications.  Results for orders placed or performed during the hospital encounter of 09/06/19 (from the past 48 hour(s))  CBC with Differential     Status: Abnormal   Collection Time: 09/06/19 10:31 AM  Result Value Ref Range   WBC 33.8 (H) 4.0 - 10.5 K/uL   RBC 3.34 (L) 4.22 - 5.81 MIL/uL   Hemoglobin 8.9 (L) 13.0 - 17.0 g/dL   HCT 12.7 (L) 51.7 - 00.1 %   MCV 81.1 80.0 - 100.0 fL   MCH 26.6 26.0 - 34.0 pg   MCHC 32.8 30.0 - 36.0 g/dL   RDW 74.9 44.9 - 67.5 %   Platelets 448 (H) 150 - 400 K/uL   nRBC 0.0 0.0 - 0.2 %   Neutrophils  Relative % 93 %   Neutro Abs 31.4 (H) 1.7 - 7.7 K/uL   Lymphocytes Relative 4 %   Lymphs Abs 1.4 0.7 - 4.0 K/uL   Monocytes Relative 3 %   Monocytes Absolute 1.0 0.1 - 1.0 K/uL   Eosinophils Relative 0 %   Eosinophils Absolute 0.0 0.0 - 0.5 K/uL   Basophils Relative 0 %   Basophils Absolute 0.0 0.0 - 0.1 K/uL   nRBC 0 0 /100 WBC   Immature Granulocytes 0 %   Abs Immature Granulocytes 0.00 0.00 - 0.07 K/uL   Burr Cells PRESENT    Polychromasia PRESENT    Target Cells PRESENT     Comment: Performed at [facility], [address] [place], [place] [address]  Lactic acid, plasma     Status: None   Collection Time: 09/06/19 10:31 AM  Result Value Ref Range   Lactic Acid, Venous 1.8 0.5 - 1.9 mmol/L    Comment: Performed at [facility], [address], [place], [place] [address]  Comprehensive metabolic panel     Status: Abnormal   Collection Time: 09/06/19 10:31 AM  Result Value Ref Range   Sodium 133 (L) 135 - 145 mmol/L  Potassium 3.0 (L) 3.5 - 5.1 mmol/L   Chloride 94 (L) 98 - 111 mmol/L   CO2 27 22 - 32 mmol/L   Glucose, Bld 217 (H) 70 - 99 mg/dL   BUN 8 6 - 20 mg/dL   Creatinine, Ser 0.04 (H) 0.61 - 1.24 mg/dL   Calcium 8.4 (L) 8.9 - 10.3 mg/dL   Total Protein 7.1 6.5 - 8.1 g/dL   Albumin 2.3 (L) 3.5 - 5.0 g/dL   AST 20 15 - 41 U/L   ALT 9 0 - 44 U/L   Alkaline Phosphatase 65 38 - 126 U/L   Total Bilirubin 1.1 0.3 - 1.2 mg/dL   GFR calc non Af Amer >60 >60 mL/min   GFR calc Af Amer >60 >60 mL/min   Anion gap 12 5 - 15    Comment: Performed at [facility], [address] [place], [place] [address]  SARS Coronavirus 2 [facility] order, Performed in [facility] hospital lab) Nasopharyngeal Nasopharyngeal Swab     Status: None   Collection Time: 09/06/19 10:54 AM   Specimen: Nasopharyngeal Swab  Result Value Ref Range   SARS Coronavirus 2 NEGATIVE NEGATIVE    Comment: (NOTE) If result is NEGATIVE SARS-CoV-2 target nucleic acids are NOT  DETECTED. The SARS-CoV-2 RNA is generally detectable in upper and lower  respiratory specimens during the acute phase of infection. The lowest  concentration of SARS-CoV-2 viral copies this assay can detect is 250  copies / mL. A negative result does not preclude SARS-CoV-2 infection  and should not be used as the sole basis for treatment or other  patient management decisions.  A negative result may occur with  improper specimen collection / handling, submission of specimen other  than nasopharyngeal swab, presence of viral mutation(s) within the  areas targeted by this assay, and inadequate number of viral copies  (<250 copies / mL). A negative result must be combined with clinical  observations, patient history, and epidemiological information. If result is POSITIVE SARS-CoV-2 target nucleic acids are DETECTED. The SARS-CoV-2 RNA is generally detectable in upper and lower  respiratory specimens dur ing the acute phase of infection.  Positive  results are indicative of active infection with SARS-CoV-2.  Clinical  correlation with patient history and other diagnostic information is  necessary to determine patient infection status.  Positive results do  not rule out bacterial infection or co-infection with other viruses. If result is PRESUMPTIVE POSTIVE SARS-CoV-2 nucleic acids MAY BE PRESENT.   A presumptive positive result was obtained on the submitted specimen  and confirmed on repeat testing.  While 2019 novel coronavirus  (SARS-CoV-2) nucleic acids may be present in the submitted sample  additional confirmatory testing may be necessary for epidemiological  and / or clinical management purposes  to differentiate between  SARS-CoV-2 and other Sarbecovirus currently known to infect humans.  If clinically indicated additional testing with an alternate test  methodology [phone]) is advised. The SARS-CoV-2 RNA is generally  detectable in upper and lower respiratory sp ecimens during  the acute  phase of infection. The expected result is Negative. Fact Sheet for Patients:  BoilerBrush.com.cy Fact Sheet for Healthcare Providers: https://pope.com/ This test is not yet approved or cleared by the [place] FDA and has been authorized for detection and/or diagnosis of SARS-CoV-2 by FDA under an Emergency Use Authorization (EUA).  This EUA will remain in effect (meaning this test can be used) for the duration of the COVID-19 declaration under Section 564(b)(1) of  the Act, 21 U.S.C. section 360bbb-3(b)(1), unless the authorization is terminated or revoked sooner. Performed at [facility], [address] [place], [place] [address]   APTT     Status: None   Collection Time: 09/06/19 10:57 AM  Result Value Ref Range   aPTT 36 24 - 36 seconds    Comment: Performed at [facility], [address] [place], [place] [address]  Protime-INR     Status: Abnormal   Collection Time: 09/06/19 10:57 AM  Result Value Ref Range   Prothrombin Time 15.4 (H) 11.4 - 15.2 seconds   INR 1.2 0.8 - 1.2    Comment: (NOTE) INR goal varies based on device and disease states. Performed at [facility], [address] [place], [place] [address]   Magnesium     Status: Abnormal   Collection Time: 09/06/19 12:30 PM  Result Value Ref Range   Magnesium 1.2 (L) 1.7 - 2.4 mg/dL    Comment: Performed at [facility], [address] [place], [place] [address]    Ct Pelvis W Contrast  Result Date: 09/06/2019 CLINICAL DATA:  Abscess the perirectal region. EXAM: CT PELVIS WITH CONTRAST TECHNIQUE: Multidetector CT imaging of the pelvis was performed using the standard protocol following the bolus administration of intravenous contrast. CONTRAST:  100mL OMNIPAQUE IOHEXOL 300 MG/ML  SOLN COMPARISON:  None. FINDINGS: Urinary Tract: The visualized distal ureters and bladder appear unremarkable. Bowel: There is diffuse rectal wall  thickening with soft tissue stranding is noted within the perineal region. Within the anterior left perineum and perirectal soft tissues there is multilocular peripherally enhancing fluid collection which extends from the anterior rectal wall to the anterior urogenital triangle and into the left inguinal region. The collection appears to extend into the left inguinal canal/scrotal sac. The collection measures 14.9 x 6.4 by 7.8 cm. There is small bilateral hydroceles. No free air is seen. Vascular/Lymphatic: No enlarged pelvic lymph nodes identified. Scattered atherosclerotic calcifications seen. Reproductive: As described above.  Small bilateral hydroceles. Other: Soft tissue swelling seen around the bilateral perirectal region, left greater than right with loculated abscess as described above. Musculoskeletal: No acute or significant osseous findings. IMPRESSION: Large multilocular abscess extending from the anterior rectal wall into the the anterior perirectal tissue measuring 14.9 x 6.4 x 7.8 cm. The abscess extends into the left perineal soft tissues, urogenital triangle and left scrotal/inguinal canal. Electronically Signed   By: Jonna ClarkBindu  Avutu M.D.   On: 09/06/2019 14:34   Dg Chest Port 1 View  Result Date: 09/06/2019 CLINICAL DATA:  Sepsis. EXAM: PORTABLE CHEST 1 VIEW COMPARISON:  No prior. FINDINGS: Mediastinum and hilar structures normal. Low lung volumes with mild bibasilar atelectasis. Mild right base infiltrate cannot be excluded. No pleural effusion or pneumothorax. Heart size normal. Thoracic spine scoliosis concave left. Degenerative changes shoulders. No acute bony abnormality identified. IMPRESSION: Low lung volumes with mild bibasilar atelectasis. Mild right base infiltrate cannot be excluded. Electronically Signed   By: Maisie Fushomas  Register   On: 09/06/2019 11:37    Review of Systems  Constitutional: Negative for chills, diaphoresis and fever.  Eyes: Positive for discharge.  Respiratory:  Negative for shortness of breath.   Cardiovascular: Negative for chest pain, palpitations, orthopnea, claudication and leg swelling.  Gastrointestinal: Negative for abdominal pain, heartburn, nausea and vomiting.   Blood pressure (!) 153/77, pulse (!) 57, temperature 99 F (37.2 C), temperature source Oral, resp. rate 12, weight 62.3 kg, SpO2 96 %. Physical  Exam  Constitutional: He is oriented to person, place, and time. No distress.  Eyes: Right eye exhibits discharge.  Right eye stuck shut. Left eye has purulence in eyelashes.   Neck: Normal range of motion. Neck supple. No tracheal deviation present. No thyromegaly present.  Respiratory: Effort normal and breath sounds normal. He exhibits no tenderness.  GI: Soft. Bowel sounds are normal. He exhibits no distension and no mass. There is no abdominal tenderness. There is no rebound and no guarding.  Musculoskeletal:        General: No tenderness, deformity or edema.  Lymphadenopathy:    He has no cervical adenopathy.  Neurological: He is alert and oriented to person, place, and time.  Skin: Skin is warm and dry.       Assessment/Plan: Perirectal abscess.  ID: vancomycin, flagyl, cefepime  FEN: NPO until surgery  DVT: none  Surgery today or tomorrow to incise and drain perirectal abscess. Continue to monitor WBCs and cmet.   Renato Gails Natacia Chaisson 09/06/2019, 3:54 PM

## 2019-09-06 NOTE — Progress Notes (Signed)
Patient arrive to 5C09 from OR. VSS. Site appears with minimal serous drainage. Dressing still in place. Patient denied pain at this time. Will continue to monitor.  Ave Filter, RN

## 2019-09-06 NOTE — ED Notes (Signed)
Consent for I&D obtained and placed at the bedside.

## 2019-09-06 NOTE — Interval H&P Note (Signed)
History and Physical Interval Note:  09/06/2019 7:15 PM  Derrick Andrade  has presented today for surgery, with the diagnosis of perirectal abcess.  The various methods of treatment have been discussed with the patient and family. After consideration of risks, benefits and other options for treatment, the patient has consented to  Procedure(s): IRRIGATION AND DEBRIDEMENT PERIRECTAL ABSCESS (N/A) as a surgical intervention.  The patient's history has been reviewed, patient examined, no change in status, stable for surgery.  I have reviewed the patient's chart and labs.  Questions were answered to the patient's satisfaction.     [name]

## 2019-09-06 NOTE — Consult Note (Signed)
Reason for Consult: Abscess  Referring Physician: Dr. Juliann Mule is an 58 y.o. male.  HPI: Pt states he has an abscess/boil on his buttocks, that has been there for about 8 days. He states he had another one in the same place 8 years ago as well. It was surgically I&D. This was also the last time he has seen a provider. He states he has not been diagnosed with any medical problems, and does not take any medications besides a Tylenol occasionally. Pt states he has no allergies. Yesterday evening was the last time he has eaten. He keeps asking for water and is angry that no one has been in to see him.   Past Medical History:  Diagnosis Date   Wrist fracture    left    Past Surgical History:  Procedure Laterality Date   INCISE AND DRAIN ABCESS     "boil" on buttock   PARS PLANA VITRECTOMY Right 03/14/2015   Procedure: Exploration Right Ruptured Globe, Vitrous Tap, Antibiotic Injection, PARS PLANA VITRECTOMY 25 GAUGE WITH LASER;  Surgeon: Carmela Rima, MD;  Location: [facility] OR;  Service: Ophthalmology;  Laterality: Right;    No family history on file.  Social History:  reports that he has been smoking. He has been smoking about 0.50 packs per day. He has never used smokeless tobacco. He reports current alcohol use. He reports that he does not use drugs.  Allergies: No Known Allergies  Medications: I have reviewed the patient's current medications.  Results for orders placed or performed during the hospital encounter of 09/06/19 (from the past 48 hour(s))  CBC with Differential     Status: Abnormal   Collection Time: 09/06/19 10:31 AM  Result Value Ref Range   WBC 33.8 (H) 4.0 - 10.5 K/uL   RBC 3.34 (L) 4.22 - 5.81 MIL/uL   Hemoglobin 8.9 (L) 13.0 - 17.0 g/dL   HCT 12.7 (L) 51.7 - 00.1 %   MCV 81.1 80.0 - 100.0 fL   MCH 26.6 26.0 - 34.0 pg   MCHC 32.8 30.0 - 36.0 g/dL   RDW 74.9 44.9 - 67.5 %   Platelets 448 (H) 150 - 400 K/uL   nRBC 0.0 0.0 - 0.2 %   Neutrophils  Relative % 93 %   Neutro Abs 31.4 (H) 1.7 - 7.7 K/uL   Lymphocytes Relative 4 %   Lymphs Abs 1.4 0.7 - 4.0 K/uL   Monocytes Relative 3 %   Monocytes Absolute 1.0 0.1 - 1.0 K/uL   Eosinophils Relative 0 %   Eosinophils Absolute 0.0 0.0 - 0.5 K/uL   Basophils Relative 0 %   Basophils Absolute 0.0 0.0 - 0.1 K/uL   nRBC 0 0 /100 WBC   Immature Granulocytes 0 %   Abs Immature Granulocytes 0.00 0.00 - 0.07 K/uL   Burr Cells PRESENT    Polychromasia PRESENT    Target Cells PRESENT     Comment: Performed at [facility], [address] [place], [place] [address]  Lactic acid, plasma     Status: None   Collection Time: 09/06/19 10:31 AM  Result Value Ref Range   Lactic Acid, Venous 1.8 0.5 - 1.9 mmol/L    Comment: Performed at [facility], [address], [place], [place] [address]  Comprehensive metabolic panel     Status: Abnormal   Collection Time: 09/06/19 10:31 AM  Result Value Ref Range   Sodium 133 (L) 135 - 145 mmol/L  Potassium 3.0 (L) 3.5 - 5.1 mmol/L   Chloride 94 (L) 98 - 111 mmol/L   CO2 27 22 - 32 mmol/L   Glucose, Bld 217 (H) 70 - 99 mg/dL   BUN 8 6 - 20 mg/dL   Creatinine, Ser 0.04 (H) 0.61 - 1.24 mg/dL   Calcium 8.4 (L) 8.9 - 10.3 mg/dL   Total Protein 7.1 6.5 - 8.1 g/dL   Albumin 2.3 (L) 3.5 - 5.0 g/dL   AST 20 15 - 41 U/L   ALT 9 0 - 44 U/L   Alkaline Phosphatase 65 38 - 126 U/L   Total Bilirubin 1.1 0.3 - 1.2 mg/dL   GFR calc non Af Amer >60 >60 mL/min   GFR calc Af Amer >60 >60 mL/min   Anion gap 12 5 - 15    Comment: Performed at [facility], [address] [place], [place] [address]  SARS Coronavirus 2 [facility] order, Performed in [facility] hospital lab) Nasopharyngeal Nasopharyngeal Swab     Status: None   Collection Time: 09/06/19 10:54 AM   Specimen: Nasopharyngeal Swab  Result Value Ref Range   SARS Coronavirus 2 NEGATIVE NEGATIVE    Comment: (NOTE) If result is NEGATIVE SARS-CoV-2 target nucleic acids are NOT  DETECTED. The SARS-CoV-2 RNA is generally detectable in upper and lower  respiratory specimens during the acute phase of infection. The lowest  concentration of SARS-CoV-2 viral copies this assay can detect is 250  copies / mL. A negative result does not preclude SARS-CoV-2 infection  and should not be used as the sole basis for treatment or other  patient management decisions.  A negative result may occur with  improper specimen collection / handling, submission of specimen other  than nasopharyngeal swab, presence of viral mutation(s) within the  areas targeted by this assay, and inadequate number of viral copies  (<250 copies / mL). A negative result must be combined with clinical  observations, patient history, and epidemiological information. If result is POSITIVE SARS-CoV-2 target nucleic acids are DETECTED. The SARS-CoV-2 RNA is generally detectable in upper and lower  respiratory specimens dur ing the acute phase of infection.  Positive  results are indicative of active infection with SARS-CoV-2.  Clinical  correlation with patient history and other diagnostic information is  necessary to determine patient infection status.  Positive results do  not rule out bacterial infection or co-infection with other viruses. If result is PRESUMPTIVE POSTIVE SARS-CoV-2 nucleic acids MAY BE PRESENT.   A presumptive positive result was obtained on the submitted specimen  and confirmed on repeat testing.  While 2019 novel coronavirus  (SARS-CoV-2) nucleic acids may be present in the submitted sample  additional confirmatory testing may be necessary for epidemiological  and / or clinical management purposes  to differentiate between  SARS-CoV-2 and other Sarbecovirus currently known to infect humans.  If clinically indicated additional testing with an alternate test  methodology [phone]) is advised. The SARS-CoV-2 RNA is generally  detectable in upper and lower respiratory sp ecimens during  the acute  phase of infection. The expected result is Negative. Fact Sheet for Patients:  BoilerBrush.com.cy Fact Sheet for Healthcare Providers: https://pope.com/ This test is not yet approved or cleared by the [place] FDA and has been authorized for detection and/or diagnosis of SARS-CoV-2 by FDA under an Emergency Use Authorization (EUA).  This EUA will remain in effect (meaning this test can be used) for the duration of the COVID-19 declaration under Section 564(b)(1) of  the Act, 21 U.S.C. °section 360bbb-3(b)(1), unless the authorization is terminated or °revoked sooner. °Performed at [facility], [address] [place], [place] °[address] °  °APTT     Status: None  ° Collection Time: 09/06/19 10:57 AM  °Result Value Ref Range  ° aPTT 36 24 - 36 seconds  °  Comment: Performed at [facility], [address] [place], [place] [address]  °Protime-INR     Status: Abnormal  ° Collection Time: 09/06/19 10:57 AM  °Result Value Ref Range  ° Prothrombin Time 15.4 (H) 11.4 - 15.2 seconds  ° INR 1.2 0.8 - 1.2  °  Comment: (NOTE) °INR goal varies based on device and disease states. °Performed at [facility], [address] [place], [place] °[address] °  °Magnesium     Status: Abnormal  ° Collection Time: 09/06/19 12:30 PM  °Result Value Ref Range  ° Magnesium 1.2 (L) 1.7 - 2.4 mg/dL  °  Comment: Performed at [facility], [address] [place], [place] [address]  ° ° °Ct Pelvis W Contrast ° °Result Date: 09/06/2019 °CLINICAL DATA:  Abscess the perirectal region. EXAM: CT PELVIS WITH CONTRAST TECHNIQUE: Multidetector CT imaging of the pelvis was performed using the standard protocol following the bolus administration of intravenous contrast. CONTRAST:  100mL OMNIPAQUE IOHEXOL 300 MG/ML  SOLN COMPARISON:  None. FINDINGS: Urinary Tract: The visualized distal ureters and bladder appear unremarkable. Bowel: There is diffuse rectal wall  thickening with soft tissue stranding is noted within the perineal region. Within the anterior left perineum and perirectal soft tissues there is multilocular peripherally enhancing fluid collection which extends from the anterior rectal wall to the anterior urogenital triangle and into the left inguinal region. The collection appears to extend into the left inguinal canal/scrotal sac. The collection measures 14.9 x 6.4 by 7.8 cm. There is small bilateral hydroceles. No free air is seen. Vascular/Lymphatic: No enlarged pelvic lymph nodes identified. Scattered atherosclerotic calcifications seen. Reproductive: As described above.  Small bilateral hydroceles. Other: Soft tissue swelling seen around the bilateral perirectal region, left greater than right with loculated abscess as described above. Musculoskeletal: No acute or significant osseous findings. IMPRESSION: Large multilocular abscess extending from the anterior rectal wall into the the anterior perirectal tissue measuring 14.9 x 6.4 x 7.8 cm. The abscess extends into the left perineal soft tissues, urogenital triangle and left scrotal/inguinal canal. Electronically Signed   By: Bindu  Avutu M.D.   On: 09/06/2019 14:34  ° °Dg Chest Port 1 View ° °Result Date: 09/06/2019 °CLINICAL DATA:  Sepsis. EXAM: PORTABLE CHEST 1 VIEW COMPARISON:  No prior. FINDINGS: Mediastinum and hilar structures normal. Low lung volumes with mild bibasilar atelectasis. Mild right base infiltrate cannot be excluded. No pleural effusion or pneumothorax. Heart size normal. Thoracic spine scoliosis concave left. Degenerative changes shoulders. No acute bony abnormality identified. IMPRESSION: Low lung volumes with mild bibasilar atelectasis. Mild right base infiltrate cannot be excluded. Electronically Signed   By: Thomas  Register   On: 09/06/2019 11:37  ° ° °Review of Systems  °Constitutional: Negative for chills, diaphoresis and fever.  °Eyes: Positive for discharge.  °Respiratory:  Negative for shortness of breath.   °Cardiovascular: Negative for chest pain, palpitations, orthopnea, claudication and leg swelling.  °Gastrointestinal: Negative for abdominal pain, heartburn, nausea and vomiting.  ° °Blood pressure (!) 153/77, pulse (!) 57, temperature 99 °F (37.2 °C), temperature source Oral, resp. rate 12, weight 62.3 kg, SpO2 96 %. °Physical   Exam  Constitutional: He is oriented to person, place, and time. No distress.  Eyes: Right eye exhibits discharge.  Right eye stuck shut. Left eye has purulence in eyelashes.   Neck: Normal range of motion. Neck supple. No tracheal deviation present. No thyromegaly present.  Respiratory: Effort normal and breath sounds normal. He exhibits no tenderness.  GI: Soft. Bowel sounds are normal. He exhibits no distension and no mass. There is no abdominal tenderness. There is no rebound and no guarding.  Musculoskeletal:        General: No tenderness, deformity or edema.  Lymphadenopathy:    He has no cervical adenopathy.  Neurological: He is alert and oriented to person, place, and time.  Skin: Skin is warm and dry.       Assessment/Plan: Perirectal abscess.  ID: vancomycin, flagyl, cefepime  FEN: NPO until surgery  DVT: none  Surgery today or tomorrow to incise and drain perirectal abscess. Continue to monitor WBCs and cmet.   Renato Gails Jacquie Lukes 09/06/2019, 3:54 PM

## 2019-09-06 NOTE — Anesthesia Preprocedure Evaluation (Addendum)
Anesthesia Evaluation  Patient identified by MRN, date of birth, ID band Patient awake    Reviewed: Allergy & Precautions, NPO status , Patient's Chart, lab work & pertinent test results  Airway Mallampati: III  TM Distance: >3 FB Neck ROM: Full    Dental  (+) Poor Dentition, Missing   Pulmonary Current Smoker and Patient abstained from smoking.,    Pulmonary exam normal breath sounds clear to auscultation       Cardiovascular negative cardio ROS Normal cardiovascular exam Rhythm:Regular Rate:Normal  ECG: rate 65. Second degree AV block, Mobitz II vs sinus arrythmia. Supraventricular bigeminy Probable left ventricular hypertrophy   Neuro/Psych negative neurological ROS  negative psych ROS   GI/Hepatic negative GI ROS, Neg liver ROS,   Endo/Other  negative endocrine ROS  Renal/GU negative Renal ROS     Musculoskeletal negative musculoskeletal ROS (+)   Abdominal   Peds  Hematology  (+) anemia ,   Anesthesia Other Findings perirectal abcess  Reproductive/Obstetrics                            Anesthesia Physical Anesthesia Plan  ASA: II and emergent  Anesthesia Plan: General   Post-op Pain Management:    Induction: Intravenous  PONV Risk Score and Plan: Ondansetron, Dexamethasone, Midazolam and Treatment may vary due to age or medical condition  Airway Management Planned: LMA  Additional Equipment:   Intra-op Plan:   Post-operative Plan: Extubation in OR  Informed Consent:     Dental advisory given  Plan Discussed with: CRNA  Anesthesia Plan Comments:        Anesthesia Quick Evaluation

## 2019-09-06 NOTE — Transfer of Care (Signed)
Immediate Anesthesia Transfer of Care Note  Patient: Derrick Andrade  Procedure(s) Performed: IRRIGATION AND DEBRIDEMENT PERIRECTAL ABSCESS (N/A Perineum)  Patient Location: PACU  Anesthesia Type:General  Level of Consciousness: sedated  Airway & Oxygen Therapy: Patient Spontanous Breathing  Post-op Assessment: Report given to RN and Post -op Vital signs reviewed and stable  Post vital signs: Reviewed and stable  Last Vitals:  Vitals Value Taken Time  BP 150/82 09/06/19 2047  Temp    Pulse    Resp 13 09/06/19 2049  SpO2    Vitals shown include unvalidated device data.  Last Pain:  Vitals:   09/06/19 1618  TempSrc:   PainSc: 4          Complications: No apparent anesthesia complications

## 2019-09-06 NOTE — ED Triage Notes (Signed)
Pt here with c/o a boil to his left buttocks history of same about 8 years ago

## 2019-09-06 NOTE — ED Triage Notes (Signed)
Pt here with c/o a boil to the left buttock , pt has history same about 8 years ago

## 2019-09-06 NOTE — Anesthesia Procedure Notes (Signed)
Procedure Name: LMA Insertion Date/Time: 09/06/2019 8:06 PM Performed by: Valetta Fuller, CRNA Pre-anesthesia Checklist: Patient identified, Emergency Drugs available, Suction available and Patient being monitored Patient Re-evaluated:Patient Re-evaluated prior to induction Oxygen Delivery Method: Circle system utilized Preoxygenation: Pre-oxygenation with 100% oxygen Induction Type: IV induction LMA: LMA inserted LMA Size: 5.0 Number of attempts: 1 Placement Confirmation: positive ETCO2 Tube secured with: Tape Dental Injury: Teeth and Oropharynx as per pre-operative assessment

## 2019-09-06 NOTE — Anesthesia Postprocedure Evaluation (Signed)
Anesthesia Post Note  Patient: Derrick Andrade  Procedure(s) Performed: IRRIGATION AND DEBRIDEMENT PERIRECTAL ABSCESS (N/A Perineum)     Patient location during evaluation: PACU Anesthesia Type: General Level of consciousness: awake and alert Pain management: pain level controlled Vital Signs Assessment: post-procedure vital signs reviewed and stable Respiratory status: spontaneous breathing, nonlabored ventilation, respiratory function stable and patient connected to nasal cannula oxygen Cardiovascular status: blood pressure returned to baseline and stable Postop Assessment: no apparent nausea or vomiting Anesthetic complications: no    Last Vitals:  Vitals:   09/06/19 2116 09/06/19 2216  BP: 126/82 137/79  Pulse:  66  Resp: 14 16  Temp: (!) 36.2 C (!) 34.4 C  SpO2: 100% 98%    Last Pain:  Vitals:   09/06/19 2216  TempSrc: Oral  PainSc:                  [name]

## 2019-09-06 NOTE — ED Provider Notes (Signed)
[facility] EMERGENCY DEPARTMENT Provider Note   CSN: [id number] Arrival date & time: 09/06/19  4098     History   Chief Complaint Chief Complaint  Patient presents with  . Groin Pain    HPI Derrick Andrade is a 58 y.o. male with a past medical history of left-sided wrist fracture, who presents today for evaluation of a boil on his buttocks.  He reports that he had something similar 8 to 10 years ago.  He states that over the past 8 days he has had worsening swelling and pain in his buttocks.  He denies any fevers, nausea vomiting or diarrhea.  He has been trying ibuprofen without significant relief of his symptoms.  He states that he has been using Neosporin to try and "bring it to a head" however that has not been successful.       HPI  Past Medical History:  Diagnosis Date  . Wrist fracture    left    There are no active problems to display for this patient.   Past Surgical History:  Procedure Laterality Date  . INCISE AND DRAIN ABCESS     "boil" on buttock  . PARS PLANA VITRECTOMY Right 03/14/2015   Procedure: Exploration Right Ruptured Globe, Vitrous Tap, Antibiotic Injection, PARS PLANA VITRECTOMY 25 GAUGE WITH LASER;  Surgeon: Jalene Mullet, MD;  Location: [facility];  Service: Ophthalmology;  Laterality: Right;        Home Medications    Prior to Admission medications   Medication Sig Start Date End Date Taking? Authorizing Provider  Carboxymethylcellul-Glycerin (CLEAR EYES FOR DRY EYES OP) Apply 1-2 drops to eye as needed (Red eyes).    [provider]  gatifloxacin (ZYMAXID) 0.5 % SOLN Place 1 drop into the right eye 4 (four) times daily. Patient not taking: Reported on 08/09/2015 03/14/15   Jalene Mullet, MD    Family History No family history on file.  Social History Social History   Tobacco Use  . Smoking status: Current Every Day Smoker    Packs/day: 0.50  . Smokeless tobacco: Never Used  Substance Use Topics  . Alcohol  use: Yes    Comment: 1 beer daily  . Drug use: No     Allergies   Patient has no known allergies.   Review of Systems Review of Systems  Constitutional: Negative for chills and fever.  Respiratory: Negative for chest tightness and shortness of breath.   Cardiovascular: Negative for chest pain.  Gastrointestinal: Positive for rectal pain. Negative for abdominal pain, diarrhea, nausea and vomiting.  Genitourinary: Positive for scrotal swelling. Negative for discharge, dysuria, frequency, hematuria and penile pain.  Musculoskeletal: Negative for back pain and neck pain.  Neurological: Negative for headaches.  All other systems reviewed and are negative.    Physical Exam Updated Vital Signs BP (!) 142/77   Pulse (!) 45   Temp 99 F (37.2 C) (Oral)   Resp 19   Wt 62.3 kg   SpO2 100%   BMI 18.12 kg/m   Physical Exam Vitals signs and nursing note reviewed. Exam conducted with a chaperone present (Dr. Sedonia Small).  Constitutional:      General: He is not in acute distress.    Appearance: He is well-developed. He is not diaphoretic.     Comments: Cachectic  HENT:     Head: Normocephalic and atraumatic.  Eyes:     General: No scleral icterus.       Right eye: No discharge.  Left eye: No discharge.     Conjunctiva/sclera: Conjunctivae normal.  Neck:     Musculoskeletal: Normal range of motion.  Cardiovascular:     Rate and Rhythm: Normal rate and regular rhythm.  Pulmonary:     Effort: Pulmonary effort is normal. No respiratory distress.     Breath sounds: No stridor.  Abdominal:     General: There is no distension.     Tenderness: There is no abdominal tenderness. There is no guarding.  Genitourinary:    Comments: There is a significant amount of edema with localized induration in patient's perineum, and scrotum.  This area is tender to palpation.  There does not appear to be a readily drainable abscess on the buttocks or perirectally. Musculoskeletal:         General: No deformity.  Skin:    General: Skin is warm and dry.  Neurological:     General: No focal deficit present.     Mental Status: He is alert.     Motor: No abnormal muscle tone.  Psychiatric:        Mood and Affect: Mood normal.        Behavior: Behavior normal.      ED Treatments / Results  Labs (all labs ordered are listed, but only abnormal results are displayed) Labs Reviewed  CBC WITH DIFFERENTIAL/PLATELET - Abnormal; Notable for the following components:      Result Value   WBC 33.8 (*)    RBC 3.34 (*)    Hemoglobin 8.9 (*)    HCT 27.1 (*)    Platelets 448 (*)    Neutro Abs 31.4 (*)    All other components within normal limits  COMPREHENSIVE METABOLIC PANEL - Abnormal; Notable for the following components:   Sodium 133 (*)    Potassium 3.0 (*)    Chloride 94 (*)    Glucose, Bld 217 (*)    Creatinine, Ser 1.30 (*)    Calcium 8.4 (*)    Albumin 2.3 (*)    All other components within normal limits  PROTIME-INR - Abnormal; Notable for the following components:   Prothrombin Time 15.4 (*)    All other components within normal limits  MAGNESIUM - Abnormal; Notable for the following components:   Magnesium 1.2 (*)    All other components within normal limits  SARS CORONAVIRUS 2 (HOSPITAL ORDER, [place] [facility])  CULTURE, BLOOD (ROUTINE X 2)  CULTURE, BLOOD (ROUTINE X 2)  URINE CULTURE  LACTIC ACID, PLASMA  APTT  LACTIC ACID, PLASMA  URINALYSIS, ROUTINE W REFLEX MICROSCOPIC    EKG EKG Interpretation  Date/Time:  Tuesday September 06 2019 12:04:53 EDT Ventricular Rate:  65 PR Interval:    QRS Duration: 98 QT Interval:  448 QTC Calculation: 466 R Axis:   74 Text Interpretation:  Second degree AV block, Mobitz II vs sinus arrythmia Supraventricular bigeminy Probable left ventricular hypertrophy Baseline wander in lead(s) V6 Confirmed by Deno Etienne [phone]) on 09/06/2019 4:53:46 PM   Radiology Ct Pelvis W Contrast  Result Date:  09/06/2019 CLINICAL DATA:  Abscess the perirectal region. EXAM: CT PELVIS WITH CONTRAST TECHNIQUE: Multidetector CT imaging of the pelvis was performed using the standard protocol following the bolus administration of intravenous contrast. CONTRAST:  148m OMNIPAQUE IOHEXOL 300 MG/ML  SOLN COMPARISON:  None. FINDINGS: Urinary Tract: The visualized distal ureters and bladder appear unremarkable. Bowel: There is diffuse rectal wall thickening with soft tissue stranding is noted within the perineal region. Within the  anterior left perineum and perirectal soft tissues there is multilocular peripherally enhancing fluid collection which extends from the anterior rectal wall to the anterior urogenital triangle and into the left inguinal region. The collection appears to extend into the left inguinal canal/scrotal sac. The collection measures 14.9 x 6.4 by 7.8 cm. There is small bilateral hydroceles. No free air is seen. Vascular/Lymphatic: No enlarged pelvic lymph nodes identified. Scattered atherosclerotic calcifications seen. Reproductive: As described above.  Small bilateral hydroceles. Other: Soft tissue swelling seen around the bilateral perirectal region, left greater than right with loculated abscess as described above. Musculoskeletal: No acute or significant osseous findings. IMPRESSION: Large multilocular abscess extending from the anterior rectal wall into the the anterior perirectal tissue measuring 14.9 x 6.4 x 7.8 cm. The abscess extends into the left perineal soft tissues, urogenital triangle and left scrotal/inguinal canal. Electronically Signed   By: Prudencio Pair M.D.   On: 09/06/2019 14:34   Dg Chest Port 1 View  Result Date: 09/06/2019 CLINICAL DATA:  Sepsis. EXAM: PORTABLE CHEST 1 VIEW COMPARISON:  No prior. FINDINGS: Mediastinum and hilar structures normal. Low lung volumes with mild bibasilar atelectasis. Mild right base infiltrate cannot be excluded. No pleural effusion or pneumothorax. Heart  size normal. Thoracic spine scoliosis concave left. Degenerative changes shoulders. No acute bony abnormality identified. IMPRESSION: Low lung volumes with mild bibasilar atelectasis. Mild right base infiltrate cannot be excluded. Electronically Signed   By: Marcello Moores  Register   On: 09/06/2019 11:37    Procedures .Critical Care Performed by: Lorin Glass, PA-C Authorized by: Lorin Glass, PA-C   Critical care provider statement:    Critical care time (minutes):  45   Critical care was necessary to treat or prevent imminent or life-threatening deterioration of the following conditions:  Sepsis   Critical care was time spent personally by me on the following activities:  Discussions with consultants, evaluation of patient's response to treatment, examination of patient, ordering and performing treatments and interventions, ordering and review of laboratory studies, ordering and review of radiographic studies, pulse oximetry, re-evaluation of patient's condition, obtaining history from patient or surrogate and review of old charts   (including critical care time)  Medications Ordered in ED Medications  vancomycin (VANCOCIN) 1,500 mg in sodium chloride 0.9 % 500 mL IVPB (1,500 mg Intravenous New Bag/Given 09/06/19 1318)  ceFEPIme (MAXIPIME) 2 g in sodium chloride 0.9 % 100 mL IVPB (has no administration in time range)  vancomycin (VANCOCIN) IVPB 1000 mg/200 mL premix (has no administration in time range)  fentaNYL (SUBLIMAZE) injection 50 mcg (has no administration in time range)  magnesium sulfate IVPB 2 g 50 mL (has no administration in time range)  ceFEPIme (MAXIPIME) 2 g in sodium chloride 0.9 % 100 mL IVPB (0 g Intravenous Stopped 09/06/19 1239)  metroNIDAZOLE (FLAGYL) IVPB 500 mg (0 mg Intravenous Stopped 09/06/19 1302)  potassium chloride SA (K-DUR) CR tablet 40 mEq (40 mEq Oral Given 09/06/19 1243)  sodium chloride 0.9 % bolus 500 mL (500 mLs Intravenous New Bag/Given 09/06/19  1245)  fentaNYL (SUBLIMAZE) injection 12.5 mcg (12.5 mcg Intravenous Given 09/06/19 1318)  iohexol (OMNIPAQUE) 300 MG/ML solution 100 mL (100 mLs Intravenous Contrast Given 09/06/19 1403)     Initial Impression / Assessment and Plan / ED Course  I have reviewed the triage vital signs and the nursing notes.  Pertinent labs & imaging results that were available during my care of the patient were reviewed by me and considered in my medical decision making (  see chart for details).  Clinical Course as of Sep 05 1613  Tue Sep 06, 2019  1057 Patient is borderline tachycardic, borderline tachypneic, and borderline febrile with soft pressures of systolic of 686.  Patient was seen by Dr. Sedonia Small and my self and  will start on sepsis antibiotics.    [EH]  1683 Patient's white count is significantly elevated.  Patient had previously not technically met sepsis criteria however with the elevated significantly white count along with his borderline vitals foremost code sepsis was called.  Sepsis orders had already been placed.  WBC(!): 33.8 [EH]  1452 CT scan shows a significant abscess.  General surgery consult placed.  I reevaluated patient and is requesting more pain meds.  Pain medicine ordered.  Magnesium level came back low in addition to hypokalemia therefore magnesium also ordered.  CT PELVIS W CONTRAST [EH]  1532 Spoke with surgery who will come see the patient.   [EH]    Clinical Course User Index [EH] Lorin Glass, PA-C      Patient presents today for evaluation of perineal swelling, pain, and induration worsening over approximately 1 week.  On exam he has significant induration tenderness and firmness in the perineal area extending from his rectum to his rhythm.  On initial evaluation patient does not meet Sirs/sepsis criteria however he became tachycardic, was borderline tachypneic, and had a white count of 33.8 after which code sepsis was called.  He was started on broad-spectrum  antibiotics.  He was noted to be hypokalemic which was orally repleted.  Magnesium was checked which was low at 1.2 and he was given IV magnesium.  Blood cultures were obtained.  COVID test is negative.  CT scan pelvis with contrast was obtained showing a 14 x 6.4 x 7.8 cm multiloculated abscess in the urogenital area extending into the scrotum, rectum.  His pain was treated with fentanyl while in the emergency room.  The patient was seen as a shared visit with Dr. Sedonia Small.    Spoke with general surgery who will come see the patient for admission.  Patient remained hemodynamically stable while in my care.   Final Clinical Impressions(s) / ED Diagnoses   Final diagnoses:  Perirectal abscess  Sepsis, due to unspecified organism, unspecified whether acute organ dysfunction present [facility])    ED Discharge Orders    None       Ollen Gross 09/06/19 1858    Maudie Flakes, MD 09/09/19 1547

## 2019-09-07 ENCOUNTER — Encounter (HOSPITAL_COMMUNITY): Payer: Self-pay | Admitting: Surgery

## 2019-09-07 LAB — GLUCOSE, CAPILLARY
Glucose-Capillary: 114 mg/dL — ABNORMAL HIGH (ref 70–99)
Glucose-Capillary: 142 mg/dL — ABNORMAL HIGH (ref 70–99)
Glucose-Capillary: 163 mg/dL — ABNORMAL HIGH (ref 70–99)
Glucose-Capillary: 197 mg/dL — ABNORMAL HIGH (ref 70–99)
Glucose-Capillary: 208 mg/dL — ABNORMAL HIGH (ref 70–99)
Glucose-Capillary: 229 mg/dL — ABNORMAL HIGH (ref 70–99)

## 2019-09-07 LAB — BASIC METABOLIC PANEL
Anion gap: 6 (ref 5–15)
BUN: 10 mg/dL (ref 6–20)
CO2: 24 mmol/L (ref 22–32)
Calcium: 8 mg/dL — ABNORMAL LOW (ref 8.9–10.3)
Chloride: 104 mmol/L (ref 98–111)
Creatinine, Ser: 1.23 mg/dL (ref 0.61–1.24)
GFR calc Af Amer: >60 mL/min (ref >60)
GFR calc non Af Amer: >60 mL/min (ref >60)
Glucose, Bld: 183 mg/dL — ABNORMAL HIGH (ref 70–99)
Potassium: 4.3 mmol/L (ref 3.5–5.1)
Sodium: 134 mmol/L — ABNORMAL LOW (ref 135–145)

## 2019-09-07 LAB — HEMOGLOBIN A1C
Hgb A1c MFr Bld: 4.9 % (ref 4.8–5.6)
Mean Plasma Glucose: 94 mg/dL

## 2019-09-07 LAB — MAGNESIUM: Magnesium: 1.8 mg/dL (ref 1.7–2.4)

## 2019-09-07 LAB — CBC
HCT: 27.5 % — ABNORMAL LOW (ref 39.0–52.0)
Hemoglobin: 9 g/dL — ABNORMAL LOW (ref 13.0–17.0)
MCH: 26.8 pg (ref 26.0–34.0)
MCHC: 32.7 g/dL (ref 30.0–36.0)
MCV: 81.8 fL (ref 80.0–100.0)
Platelets: 403 10*3/uL — ABNORMAL HIGH (ref 150–400)
RBC: 3.36 MIL/uL — ABNORMAL LOW (ref 4.22–5.81)
RDW: 13.9 % (ref 11.5–15.5)
WBC: 43.4 10*3/uL — ABNORMAL HIGH (ref 4.0–10.5)
nRBC: 0 % (ref 0.0–0.2)

## 2019-09-07 LAB — HIV ANTIBODY (ROUTINE TESTING W REFLEX): HIV Screen 4th Generation wRfx: NONREACTIVE

## 2019-09-07 MED ORDER — METRONIDAZOLE 500 MG PO TABS
500.0000 mg | ORAL_TABLET | Freq: Three times a day (TID) | ORAL | Status: DC
  Administered 2019-09-07 – 2019-09-09 (×7): 500 mg via ORAL
  Filled 2019-09-07 (×9): qty 1

## 2019-09-07 NOTE — Discharge Instructions (Signed)
Anorectal Abscess An abscess is an infected area that contains a collection of pus. An anorectal abscess is an abscess that is near the opening of the anus or around the rectum. Without treatment, an anorectal abscess can become larger and cause other problems, such as a more serious body-wide infection or pain, especially during bowel movements. What are the causes? This condition is caused by plugged glands or an infection in one of these areas:  The anus.  The area between the anus and the scrotum in males or between the anus and the vagina in females (perineum). What increases the risk? The following factors may make you more likely to develop this condition:  Diabetes or inflammatory bowel disease.  Having a body defense system (immune system) that is weak.  Engaging in anal sex.  Having a sexually transmitted infection (STI).  Certain kinds of cancer, such as rectal carcinoma, leukemia, or lymphoma. What are the signs or symptoms? The main symptom of this condition is pain. The pain may be a throbbing pain that gets worse during bowel movements. Other symptoms include:  Swelling and redness in the area of the abscess. The redness may go beyond the abscess and appear as a red streak on the skin.  A visible, painful lump, or a lump that can be felt when touched.  Bleeding or pus-like discharge from the area.  Fever.  General weakness.  Constipation.  Diarrhea. How is this diagnosed? This condition is diagnosed based on your medical history and a physical exam of the affected area.  This may involve examining the rectal area with a gloved hand (digital rectal exam).  Sometimes, the health care provider needs to look into the rectum using a probe, scope, or imaging test.  For women, it may require a careful vaginal exam. How is this treated? Treatment for this condition may include:  Incision and drainage surgery. This involves making an incision over the abscess to  drain the pus.  Medicines, including antibiotic medicine, pain medicine, stool softeners, or laxatives. Follow these instructions at home: Medicines  Take over-the-counter and prescription medicines only as told by your health care provider.  If you were prescribed an antibiotic medicine, use it as told by your health care provider. Do not stop using the antibiotic even if you start to feel better.  Do not drive or use heavy machinery while taking prescription pain medicine. Wound care   If gauze was used in the abscess, follow instructions from your health care provider about removing or changing the gauze. It can usually be removed in 2-3 days.  Wash your hands with soap and water before you remove or change your gauze. If soap and water are not available, use hand sanitizer.  If one or more drains were placed in the abscess cavity, be careful not to pull at them. Your health care provider will tell you how long they need to remain in place.  Check your incision area every day for signs of infection. Check for: ? More redness, swelling, or pain. ? More fluid or blood. ? Warmth. ? Pus or a bad smell. Managing pain, stiffness, and swelling   Take a sitz bath 3-4 times a day and after bowel movements. This will help reduce pain and swelling.  To relieve pain, try sitting: ? On a heating pad with the setting on low. ? On an inflatable donut-shaped cushion.  If directed, put ice on the affected area: ? Put ice in a plastic bag. ? Place   a towel between your skin and the bag. ? Leave the ice on for 20 minutes, 2-3 times a day. General instructions  Follow any diet instructions given by your health care provider.  Keep all follow-up visits as told by your health care provider. This is important. Contact a health care provider if you have:  Bleeding from your incision.  Pain, swelling, or redness that does not improve or gets worse.  Trouble passing stool or  urine.  Symptoms that return after treatment. Get help right away if you:  Have problems moving or using your legs.  Have severe or increasing pain.  Have swelling in the affected area that suddenly gets worse.  Have a large increase in bleeding or passing of pus.  Develop chills or a fever. Summary  An anorectal abscess is an abscess that is near the opening of the anus or around the rectum. An abscess is an infected area that contains a collection of pus.  The main symptom of this condition is pain. It may be a throbbing pain that gets worse during bowel movements.  Treatment for an anorectal abscess may include surgery to drain the pus from the abscess. Medicines and sitz baths may also be a part of your treatment plan. This information is not intended to replace advice given to you by your health care provider. Make sure you discuss any questions you have with your health care provider. Document Released: 11/28/2000 Document Revised: 01/07/2018 Document Reviewed: 01/07/2018 Elsevier Patient Education  2020 [employer]   How to Take a Sitz Bath A sitz bath is a warm water bath that may be used to care for your rectum, genital area, or the area between your rectum and genitals (perineum). For a sitz bath, the water only comes up to your hips and covers your buttocks. A sitz bath may done at home in a bathtub or with a portable sitz bath that fits over the toilet. Your health care provider may recommend a sitz bath to help:  Relieve pain and discomfort after delivering a baby.  Relieve pain and itching from hemorrhoids or anal fissures.  Relieve pain after certain surgeries.  Relax muscles that are sore or tight. How to take a sitz bath Take 3-4 sitz baths a day, or as many as told by your health care provider. Bathtub sitz bath To take a sitz bath in a bathtub: 1. Partially fill a bathtub with warm water. The water should be deep enough to cover your hips and buttocks  when you are sitting in the tub. 2. If your health care provider told you to put medicine in the water, follow his or her instructions. 3. Sit in the water. 4. Open the tub drain a little, and leave it open during your bath. 5. Turn on the warm water again, enough to replace the water that is draining out. Keep the water running throughout your bath. This helps keep the water at the right level and the right temperature. 6. Soak in the water for 15-20 minutes, or as long as told by your health care provider. 7. When you are done, be careful when you stand up. You may feel dizzy. 8. After the sitz bath, pat yourself dry. Do not rub your skin to dry it.  Over-the-toilet sitz bath To take a sitz bath with an over-the-toilet basin: 1. Follow the manufacturer's instructions. 2. Fill the basin with warm water. 3. If your health care provider told you to put medicine in the   water, follow his or her instructions. 4. Sit on the seat. Make sure the water covers your buttocks and perineum. 5. Soak in the water for 15-20 minutes, or as long as told by your health care provider. 6. After the sitz bath, pat yourself dry. Do not rub your skin to dry it. 7. Clean and dry the basin between uses. 8. Discard the basin if it cracks, or according to the manufacturer's instructions. Contact a health care provider if:  Your symptoms get worse. Do not continue with sitz baths if your symptoms get worse.  You have new symptoms. If this happens, do not continue with sitz baths until you talk with your health care provider. Summary  A sitz bath is a warm water bath in which the water only comes up to your hips and covers your buttocks.  A sitz bath may help relieve itching, relieve pain, and relax muscles that are sore or tight in the lower part of your body, including your genital area.  Take 3-4 sitz baths a day, or as many as told by your health care provider. Soak in the water for 15-20 minutes.  Do not  continue with sitz baths if your symptoms get worse. This information is not intended to replace advice given to you by your health care provider. Make sure you discuss any questions you have with your health care provider. Document Released: 08/23/2004 Document Revised: 12/03/2017 Document Reviewed: 12/03/2017 Elsevier Patient Education  2020 [employer]  

## 2019-09-07 NOTE — Progress Notes (Signed)
[facility] Progress Note  1 Day Post-Op  Subjective: CC: perirectal abscess Patient reports pain well controlled. Was hoping he would be able to go home today but ok with staying if needed. Tolerating diet, denies n/v. No flatus.   Objective: Vital signs in last 24 hours: Temp:  [94 F (34.4 C)-99 F (37.2 C)] 97.5 F (36.4 C) (09/23 0316) Pulse Rate:  [45-97] 66 (09/23 0316) Resp:  [11-20] 20 (09/23 0316) BP: (101-179)/(60-88) 137/79 (09/22 2216) SpO2:  [96 %-100 %] 98 % (09/23 0316) Weight:  [62.3 kg] 62.3 kg (09/22 1050)    Intake/Output from previous day: 09/22 0701 - 09/23 0700 In: 1640.2 [I.V.:792.9; IV Piggyback:847.3] Out: 15 [Blood:15] Intake/Output this shift: No intake/output data recorded.  PE: Gen:  Alert, NAD, pleasant Card:  Regular rate and rhythm Pulm:  Normal effort, clear to auscultation bilaterally Abd: Soft, non-tender, non-distended, +BS GU: incision in L buttock, penrose present, moderate amount bloody purulent drainage, some surrounding induration Skin: warm and dry, no rashes  Psych: A&Ox3   Lab Results:  Recent Labs    09/06/19 1031 09/07/19 0628  WBC 33.8* 43.4*  HGB 8.9* 9.0*  HCT 27.1* 27.5*  PLT 448* 403*   BMET Recent Labs    09/06/19 1031 09/07/19 0628  NA 133* 134*  K 3.0* 4.3  CL 94* 104  CO2 27 24  GLUCOSE 217* 183*  BUN 8 10  CREATININE 1.30* 1.23  CALCIUM 8.4* 8.0*   PT/INR Recent Labs    09/06/19 1057  LABPROT 15.4*  INR 1.2   CMP     Component Value Date/Time   NA 134 (L) 09/07/2019 0628   K 4.3 09/07/2019 0628   CL 104 09/07/2019 0628   CO2 24 09/07/2019 0628   GLUCOSE 183 (H) 09/07/2019 0628   BUN 10 09/07/2019 0628   CREATININE 1.23 09/07/2019 0628   CALCIUM 8.0 (L) 09/07/2019 0628   PROT 7.1 09/06/2019 1031   ALBUMIN 2.3 (L) 09/06/2019 1031   AST 20 09/06/2019 1031   ALT 9 09/06/2019 1031   ALKPHOS 65 09/06/2019 1031   BILITOT 1.1 09/06/2019 1031   GFRNONAA >60 09/07/2019 0628    GFRAA >60 09/07/2019 0628   Lipase  No results found for: LIPASE     Studies/Results: Ct Pelvis W Contrast  Result Date: 09/06/2019 CLINICAL DATA:  Abscess the perirectal region. EXAM: CT PELVIS WITH CONTRAST TECHNIQUE: Multidetector CT imaging of the pelvis was performed using the standard protocol following the bolus administration of intravenous contrast. CONTRAST:  167mL OMNIPAQUE IOHEXOL 300 MG/ML  SOLN COMPARISON:  None. FINDINGS: Urinary Tract: The visualized distal ureters and bladder appear unremarkable. Bowel: There is diffuse rectal wall thickening with soft tissue stranding is noted within the perineal region. Within the anterior left perineum and perirectal soft tissues there is multilocular peripherally enhancing fluid collection which extends from the anterior rectal wall to the anterior urogenital triangle and into the left inguinal region. The collection appears to extend into the left inguinal canal/scrotal sac. The collection measures 14.9 x 6.4 by 7.8 cm. There is small bilateral hydroceles. No free air is seen. Vascular/Lymphatic: No enlarged pelvic lymph nodes identified. Scattered atherosclerotic calcifications seen. Reproductive: As described above.  Small bilateral hydroceles. Other: Soft tissue swelling seen around the bilateral perirectal region, left greater than right with loculated abscess as described above. Musculoskeletal: No acute or significant osseous findings. IMPRESSION: Large multilocular abscess extending from the anterior rectal wall into the the anterior perirectal tissue measuring 14.9  x 6.4 x 7.8 cm. The abscess extends into the left perineal soft tissues, urogenital triangle and left scrotal/inguinal canal. Electronically Signed   By: Jonna Clark M.D.   On: 09/06/2019 14:34   Dg Chest Port 1 View  Result Date: 09/06/2019 CLINICAL DATA:  Sepsis. EXAM: PORTABLE CHEST 1 VIEW COMPARISON:  No prior. FINDINGS: Mediastinum and hilar structures normal. Low lung  volumes with mild bibasilar atelectasis. Mild right base infiltrate cannot be excluded. No pleural effusion or pneumothorax. Heart size normal. Thoracic spine scoliosis concave left. Degenerative changes shoulders. No acute bony abnormality identified. IMPRESSION: Low lung volumes with mild bibasilar atelectasis. Mild right base infiltrate cannot be excluded. Electronically Signed   By: Maisie Fus  Register   On: 09/06/2019 11:37    Anti-infectives: Anti-infectives (From admission, onward)   Start     Dose/Rate Route Frequency Ordered Stop   09/07/19 1300  vancomycin (VANCOCIN) IVPB 1000 mg/200 mL premix     1,000 mg 200 mL/hr over 60 Minutes Intravenous Every 24 hours 09/06/19 1233     09/07/19 0000  ceFEPIme (MAXIPIME) 2 g in sodium chloride 0.9 % 100 mL IVPB     2 g 200 mL/hr over 30 Minutes Intravenous Every 12 hours 09/06/19 1225     09/06/19 1115  vancomycin (VANCOCIN) 1,500 mg in sodium chloride 0.9 % 500 mL IVPB     1,500 mg 250 mL/hr over 120 Minutes Intravenous  Once 09/06/19 1112 09/06/19 1518   09/06/19 1100  ceFEPIme (MAXIPIME) 2 g in sodium chloride 0.9 % 100 mL IVPB     2 g 200 mL/hr over 30 Minutes Intravenous  Once 09/06/19 1057 09/06/19 1239   09/06/19 1100  metroNIDAZOLE (FLAGYL) IVPB 500 mg     500 mg 100 mL/hr over 60 Minutes Intravenous  Once 09/06/19 1057 09/06/19 1302   09/06/19 1100  vancomycin (VANCOCIN) IVPB 1000 mg/200 mL premix  Status:  Discontinued     1,000 mg 200 mL/hr over 60 Minutes Intravenous  Once 09/06/19 1057 09/06/19 1112       Assessment/Plan Perirectal abscess S/p I&D 09/06/19 Dr. Fredricka Bonine - WBC 43 this AM, afebrile - will plan to remove packing and start sitz baths tomorrow - if wound/dressing becomes soiled with stool prior to tomorrow AM, can remove packing and start sitz bath sooner - cx pending  FEN: CM diet VTE: SCDs, lovenox ID: flagyl 9/22, vanc/cefepime 9/22>>  LOS: 0 days    Wells Guiles , [facility]  Surgery 09/07/2019, 7:46 AM Pager: [phone] Consults: [phone] 7:00 AM - 4:30 PM M, W-F 7:00 AM - 11:30 AM Tues, Sat, Sun

## 2019-09-08 LAB — CBC
HCT: 24.3 % — ABNORMAL LOW (ref 39.0–52.0)
Hemoglobin: 8 g/dL — ABNORMAL LOW (ref 13.0–17.0)
MCH: 26.9 pg (ref 26.0–34.0)
MCHC: 32.9 g/dL (ref 30.0–36.0)
MCV: 81.8 fL (ref 80.0–100.0)
Platelets: 405 10*3/uL — ABNORMAL HIGH (ref 150–400)
RBC: 2.97 MIL/uL — ABNORMAL LOW (ref 4.22–5.81)
RDW: 14.3 % (ref 11.5–15.5)
WBC: 33.9 10*3/uL — ABNORMAL HIGH (ref 4.0–10.5)
nRBC: 0 % (ref 0.0–0.2)

## 2019-09-08 LAB — BASIC METABOLIC PANEL
Anion gap: 6 (ref 5–15)
BUN: 12 mg/dL (ref 6–20)
CO2: 23 mmol/L (ref 22–32)
Calcium: 8.3 mg/dL — ABNORMAL LOW (ref 8.9–10.3)
Chloride: 107 mmol/L (ref 98–111)
Creatinine, Ser: 1.13 mg/dL (ref 0.61–1.24)
GFR calc Af Amer: >60 mL/min (ref >60)
GFR calc non Af Amer: >60 mL/min (ref >60)
Glucose, Bld: 139 mg/dL — ABNORMAL HIGH (ref 70–99)
Potassium: 4.7 mmol/L (ref 3.5–5.1)
Sodium: 136 mmol/L (ref 135–145)

## 2019-09-08 LAB — GLUCOSE, CAPILLARY
Glucose-Capillary: 104 mg/dL — ABNORMAL HIGH (ref 70–99)
Glucose-Capillary: 105 mg/dL — ABNORMAL HIGH (ref 70–99)
Glucose-Capillary: 112 mg/dL — ABNORMAL HIGH (ref 70–99)
Glucose-Capillary: 158 mg/dL — ABNORMAL HIGH (ref 70–99)
Glucose-Capillary: 169 mg/dL — ABNORMAL HIGH (ref 70–99)
Glucose-Capillary: 87 mg/dL (ref 70–99)
Glucose-Capillary: 96 mg/dL (ref 70–99)

## 2019-09-08 NOTE — Progress Notes (Signed)
Pt given bath and Sitz bath tolerated well. Minimal drainage on old pad.

## 2019-09-08 NOTE — Progress Notes (Signed)
[facility] Progress Note  2 Days Post-Op  Subjective: CC: perirectal abscess Packing removed this AM and patient tolerated well. He really would like to go home. Denies pain outside of packing removal. We again discussed showers and sitz baths.   Objective: Vital signs in last 24 hours: Temp:  [97.8 F (36.6 C)-98.2 F (36.8 C)] 98.2 F (36.8 C) (09/24 0558) Pulse Rate:  [56-64] 64 (09/24 0558) Resp:  [17-18] 17 (09/24 0558) BP: (108-146)/(66-83) 118/66 (09/24 0558) SpO2:  [97 %-100 %] 97 % (09/24 0558)    Intake/Output from previous day: 09/23 0701 - 09/24 0700 In: 240 [P.O.:240] Out: -  Intake/Output this shift: No intake/output data recorded.  PE: Gen:  Alert, NAD, pleasant Card:  Regular rate and rhythm Pulm:  Normal effort, clear to auscultation bilaterally Abd: Soft, non-tender, non-distended, +BS GU: incision in L buttock, penrose present, moderate amount bloody purulent drainage on packing, some surrounding induration still present but improving  Skin: warm and dry, no rashes  Psych: A&Ox3   Lab Results:  Recent Labs    09/07/19 0628 09/08/19 0453  WBC 43.4* 33.9*  HGB 9.0* 8.0*  HCT 27.5* 24.3*  PLT 403* 405*   BMET Recent Labs    09/07/19 0628 09/08/19 0453  NA 134* 136  K 4.3 4.7  CL 104 107  CO2 24 23  GLUCOSE 183* 139*  BUN 10 12  CREATININE 1.23 1.13  CALCIUM 8.0* 8.3*   PT/INR Recent Labs    09/06/19 1057  LABPROT 15.4*  INR 1.2   CMP     Component Value Date/Time   NA 136 09/08/2019 0453   K 4.7 09/08/2019 0453   CL 107 09/08/2019 0453   CO2 23 09/08/2019 0453   GLUCOSE 139 (H) 09/08/2019 0453   BUN 12 09/08/2019 0453   CREATININE 1.13 09/08/2019 0453   CALCIUM 8.3 (L) 09/08/2019 0453   PROT 7.1 09/06/2019 1031   ALBUMIN 2.3 (L) 09/06/2019 1031   AST 20 09/06/2019 1031   ALT 9 09/06/2019 1031   ALKPHOS 65 09/06/2019 1031   BILITOT 1.1 09/06/2019 1031   GFRNONAA >60 09/08/2019 0453   GFRAA >60 09/08/2019  0453   Lipase  No results found for: LIPASE     Studies/Results: Ct Pelvis W Contrast  Result Date: 09/06/2019 CLINICAL DATA:  Abscess the perirectal region. EXAM: CT PELVIS WITH CONTRAST TECHNIQUE: Multidetector CT imaging of the pelvis was performed using the standard protocol following the bolus administration of intravenous contrast. CONTRAST:  128mL OMNIPAQUE IOHEXOL 300 MG/ML  SOLN COMPARISON:  None. FINDINGS: Urinary Tract: The visualized distal ureters and bladder appear unremarkable. Bowel: There is diffuse rectal wall thickening with soft tissue stranding is noted within the perineal region. Within the anterior left perineum and perirectal soft tissues there is multilocular peripherally enhancing fluid collection which extends from the anterior rectal wall to the anterior urogenital triangle and into the left inguinal region. The collection appears to extend into the left inguinal canal/scrotal sac. The collection measures 14.9 x 6.4 by 7.8 cm. There is small bilateral hydroceles. No free air is seen. Vascular/Lymphatic: No enlarged pelvic lymph nodes identified. Scattered atherosclerotic calcifications seen. Reproductive: As described above.  Small bilateral hydroceles. Other: Soft tissue swelling seen around the bilateral perirectal region, left greater than right with loculated abscess as described above. Musculoskeletal: No acute or significant osseous findings. IMPRESSION: Large multilocular abscess extending from the anterior rectal wall into the the anterior perirectal tissue measuring 14.9 x 6.4 x  7.8 cm. The abscess extends into the left perineal soft tissues, urogenital triangle and left scrotal/inguinal canal. Electronically Signed   By: Jonna Clark M.D.   On: 09/06/2019 14:34   Dg Chest Port 1 View  Result Date: 09/06/2019 CLINICAL DATA:  Sepsis. EXAM: PORTABLE CHEST 1 VIEW COMPARISON:  No prior. FINDINGS: Mediastinum and hilar structures normal. Low lung volumes with mild  bibasilar atelectasis. Mild right base infiltrate cannot be excluded. No pleural effusion or pneumothorax. Heart size normal. Thoracic spine scoliosis concave left. Degenerative changes shoulders. No acute bony abnormality identified. IMPRESSION: Low lung volumes with mild bibasilar atelectasis. Mild right base infiltrate cannot be excluded. Electronically Signed   By: Maisie Fus  Register   On: 09/06/2019 11:37    Anti-infectives: Anti-infectives (From admission, onward)   Start     Dose/Rate Route Frequency Ordered Stop   09/07/19 1300  vancomycin (VANCOCIN) IVPB 1000 mg/200 mL premix     1,000 mg 200 mL/hr over 60 Minutes Intravenous Every 24 hours 09/06/19 1233     09/07/19 0930  metroNIDAZOLE (FLAGYL) tablet 500 mg     500 mg Oral Every 8 hours 09/07/19 0920     09/07/19 0000  ceFEPIme (MAXIPIME) 2 g in sodium chloride 0.9 % 100 mL IVPB     2 g 200 mL/hr over 30 Minutes Intravenous Every 12 hours 09/06/19 1225     09/06/19 1115  vancomycin (VANCOCIN) 1,500 mg in sodium chloride 0.9 % 500 mL IVPB     1,500 mg 250 mL/hr over 120 Minutes Intravenous  Once 09/06/19 1112 09/06/19 1518   09/06/19 1100  ceFEPIme (MAXIPIME) 2 g in sodium chloride 0.9 % 100 mL IVPB     2 g 200 mL/hr over 30 Minutes Intravenous  Once 09/06/19 1057 09/06/19 1239   09/06/19 1100  metroNIDAZOLE (FLAGYL) IVPB 500 mg     500 mg 100 mL/hr over 60 Minutes Intravenous  Once 09/06/19 1057 09/06/19 1302   09/06/19 1100  vancomycin (VANCOCIN) IVPB 1000 mg/200 mL premix  Status:  Discontinued     1,000 mg 200 mL/hr over 60 Minutes Intravenous  Once 09/06/19 1057 09/06/19 1112       Assessment/Plan Perirectal abscess S/p I&D 09/06/19 Dr. Fredricka Bonine - WBC 33 this AM, afebrile - packing removed, penrose drain present - start sitz baths/showers - cx pending - discussed with patient that I would not recommend discharge with high WBC and cx still pending but will discuss with MD as well  FEN: CM diet VTE: SCDs, lovenox ID:  IV flagyl 9/22, vanc/cefepime 9/22>>, PO flagyl 9/23>>  LOS: 1 day    Wells Guiles , [facility] Surgery 09/08/2019, 8:29 AM Pager: [phone] Consults: [phone] 7:00 AM - 4:30 PM M, W-F 7:00 AM - 11:30 AM Tues, Sat, Sun

## 2019-09-09 LAB — CBC
HCT: 23.2 % — ABNORMAL LOW (ref 39.0–52.0)
Hemoglobin: 7.4 g/dL — ABNORMAL LOW (ref 13.0–17.0)
MCH: 26.1 pg (ref 26.0–34.0)
MCHC: 31.9 g/dL (ref 30.0–36.0)
MCV: 82 fL (ref 80.0–100.0)
Platelets: 409 10*3/uL — ABNORMAL HIGH (ref 150–400)
RBC: 2.83 MIL/uL — ABNORMAL LOW (ref 4.22–5.81)
RDW: 14.5 % (ref 11.5–15.5)
WBC: 15.5 10*3/uL — ABNORMAL HIGH (ref 4.0–10.5)
nRBC: 0.1 % (ref 0.0–0.2)

## 2019-09-09 LAB — GLUCOSE, CAPILLARY
Glucose-Capillary: 93 mg/dL (ref 70–99)
Glucose-Capillary: 117 mg/dL — ABNORMAL HIGH (ref 70–99)

## 2019-09-09 MED ORDER — METRONIDAZOLE 500 MG PO TABS: 500.0000 mg | ORAL_TABLET | Freq: Three times a day (TID) | ORAL | 0 refills | Status: AC

## 2019-09-09 MED ORDER — AMOXICILLIN-POT CLAVULANATE 875-125 MG PO TABS: 1.0000 | ORAL_TABLET | Freq: Two times a day (BID) | ORAL | 0 refills | Status: DC

## 2019-09-09 MED ORDER — ACETAMINOPHEN 325 MG PO TABS: 650.0000 mg | ORAL_TABLET | Freq: Four times a day (QID) | ORAL | Status: AC | PRN

## 2019-09-09 MED ORDER — CEFDINIR 300 MG PO CAPS: 600.0000 mg | ORAL_CAPSULE | Freq: Every day | ORAL | 0 refills | Status: DC

## 2019-09-09 MED ORDER — POLYETHYLENE GLYCOL 3350 17 G PO PACK: 17.0000 g | PACK | Freq: Every day | ORAL | Status: DC | PRN

## 2019-09-09 MED ORDER — IBUPROFEN 200 MG PO TABS: 400.0000 mg | ORAL_TABLET | Freq: Four times a day (QID) | ORAL | Status: DC | PRN

## 2019-09-09 MED ORDER — DOCUSATE SODIUM 100 MG PO CAPS: 100.0000 mg | ORAL_CAPSULE | Freq: Every day | ORAL | Status: DC | PRN

## 2019-09-09 NOTE — Discharge Summary (Signed)
[facility] Discharge Summary   Patient ID: Derrick Andrade MRN: [id number] DOB/AGE: 1961/10/22 58 y.o.  Admit date: 09/06/2019 Discharge date: 09/09/2019  Admitting Diagnosis: Perirectal abscess  Discharge Diagnosis Patient Active Problem List   Diagnosis Date Noted  . Perirectal abscess 09/06/2019    Consultants None  Imaging: No results found.  Procedures Dr. Kae Heller (09/06/19) - incision and drainage  Hospital Course:  Patient is a 58 year old male who [facility] to [facility] with a "boil" on his buttock.  Workup showed perirectal abscess.  Patient was admitted and underwent procedure listed above.  Tolerated procedure well and was transferred to the floor.  Diet was advanced as tolerated.  On POD#3, the patient was voiding well, tolerating diet, ambulating well, pain well controlled, vital signs stable, wound with penrose present and felt stable for discharge home.  Patient will follow up in our office in 1 weeks and knows to call with questions or concerns.  He will call to confirm appointment date/time.    Physical Exam: Gen: Alert, NAD, pleasant Card: Regular rate and rhythm Pulm: Normal effort, clear to auscultation bilaterally Abd: Soft, non-tender, non-distended, +BS GU: incision in L buttock, penrose present, small amount purulent drainage, some surrounding induration still present but improving  Skin: warm and dry, no rashes  Psych: A&Ox3   Allergies as of 09/09/2019   No Known Allergies     Medication List    TAKE these medications   acetaminophen 325 MG tablet Commonly known as: TYLENOL Take 2 tablets (650 mg total) by mouth every 6 (six) hours as needed for mild pain (or temp > 100).   amoxicillin-clavulanate 875-125 MG tablet Commonly known as: Augmentin Take 1 tablet by mouth every 12 (twelve) hours for 7 days.   docusate sodium 100 MG capsule Commonly known as: COLACE Take 1 capsule (100 mg total) by mouth daily as needed for mild  constipation.   ibuprofen 200 MG tablet Commonly known as: Motrin IB Take 2 tablets (400 mg total) by mouth every 6 (six) hours as needed for moderate pain.   polyethylene glycol 17 g packet Commonly known as: MIRALAX / GLYCOLAX Take 17 g by mouth daily as needed for mild constipation.        Follow-up Information    Sur[facility], [place]. Go on 09/15/2019.   Specialty: General Surgery Why: Follow up appointment at 2 PM. Please arrive 30 min prior to appointment time. Bring photo ID and insurance information.  Contact information: [place] [id number] [phone]        [place]. Call.   Why: Call and schedule an appointment to establish with a primary care provider.  Contact information: [place] [id number] [phone]          Signed: Brigid Re, [facility] Surgery 09/09/2019, 9:45 AM Pager: [phone] Consults: [phone]

## 2019-09-09 NOTE — TOC Initial Note (Signed)
Transition of Care [facility]) - Initial/Assessment Note    Patient Details  Name: Derrick Andrade MRN: [id number] Date of Birth: 1961-09-22  Transition of Care [facility]) CM/SW Contact:    Fuller Mandril, RN Phone Number: 09/09/2019, 11:15 AM  Clinical Narrative:                 [facility] consulted regarding medication needs for uninsured pt.  Expected Discharge Plan: Home/Self Care Barriers to Discharge: Inadequate or no insurance   Patient Goals and CMS Choice Patient states their goals for this hospitalization and ongoing recovery are:: Go home      Expected Discharge Plan and Services Expected Discharge Plan: Home/Self Care   Discharge Planning Services: CM Consult, [place] [facility]   Living arrangements for the past 2 months: Single Family Home Expected Discharge Date: 09/09/19                                    Prior Living Arrangements/Services Living arrangements for the past 2 months: Single Family Home Lives with:: Significant Other Patient language and need for interpreter reviewed:: Yes        Need for Family Participation in Patient Care: No (Comment) Care giver support system in place?: Yes (comment)   Criminal Activity/Legal Involvement Pertinent to Current Situation/Hospitalization: No - Comment as needed      Emotional Assessment Appearance:: Appears older than stated age Attitude/Demeanor/Rapport: Gracious Affect (typically observed): Appropriate Orientation: : Oriented to Self, Oriented to Place, Oriented to  Time, Oriented to Situation Alcohol / Substance Use: Tobacco Use Psych Involvement: No (comment)  Admission diagnosis:  Perirectal abscess [K61.1] Sepsis, due to unspecified organism, unspecified whether acute organ dysfunction present [facility]) [A41.9] Patient Active Problem List   Diagnosis Date Noted  . Perirectal abscess 09/06/2019   PCP:  Patient, No Pcp Per Pharmacy:   [id number] - , [place] - [address] [place] [address] Phone: [phone] Fax: [phone]      Readmission Risk Interventions No flowsheet data found.

## 2019-09-09 NOTE — TOC Transition Note (Signed)
Transition of Care [facility]) - CM/SW Discharge Note   Patient Details  Name: Derrick Andrade MRN: [id number] Date of Birth: 12-14-1961  Transition of Care [facility]) CM/SW Contact:  Fuller Mandril, RN Phone Number: 09/09/2019, 11:22 AM   Clinical Narrative:    ED CM consulted for medication assistance of uninsured pt. EDCM reviewed chart and spoke with the pt about [employer] MATCH program ($3 co pay for each Rx through [facility] program, does not include refills, 7 day expiration of [place] letter and choice of pharmacies).  Pt is eligible for [facility] MATCH program (unable to find pt listed in PROCARE per cardholder name inquiry) and has agreed to accept [facility] but can not afford co-pay. PROCARE information entered with and override on co-pay fees. [place] letter completed and provided to pt.  EDCM updated bedside  RN.   EDCM also confirmed that pt does not have PCP. NCM discussed and provided written information for uninsured PCP and the importance of PCP for f/u care.    Barriers to Discharge: Inadequate or no insurance Barriers Resolved  Patient Goals and CMS Choice Patient states their goals for this hospitalization and ongoing recovery are:: Go home      Discharge Placement                       Discharge Plan and Services   Discharge Planning Services: CM Consult, [place] [facility]                                 Social Determinants of Health (SDOH) Interventions     Readmission Risk Interventions No flowsheet data found.

## 2019-09-09 NOTE — Progress Notes (Signed)
RN gave pt discharge instructions and pt stated understanding. Prescriptions escribed to pt home pharmcy and CM gave pt Match letter for prescriptions

## 2019-09-11 LAB — AEROBIC/ANAEROBIC CULTURE W GRAM STAIN (SURGICAL/DEEP WOUND)

## 2019-09-11 LAB — CULTURE, BLOOD (ROUTINE X 2)
Culture: NO GROWTH
Culture: NO GROWTH
Special Requests: ADEQUATE
Special Requests: ADEQUATE

## 2020-10-25 ENCOUNTER — Telehealth (INDEPENDENT_AMBULATORY_CARE_PROVIDER_SITE_OTHER): Payer: Self-pay | Admitting: General Practice

## 2020-10-25 NOTE — Telephone Encounter (Signed)
Copied from CRM [phone]. Topic: General - Other >> Oct 25, 2020 10:30 AM Elliot Gault wrote: Caller name: Alberteen Spindle   Relation to pt: friend  Call back [phone]    Reason for call:  Seeking financial assistance, patient would like a follow up call as soon as possible to discuss the resources offered for a person who has no insurance or no financial income, patient is loosing sight in both eyes.

## 2020-10-26 NOTE — Telephone Encounter (Signed)
I return Pt call, person that answer the phone said wrong number, I can not inform the Pt that [facility] offer a financial program to help with the medical bill, here at [facility] need to an establish Pt before he can apply for those program at this time the Pt is not

## 2020-10-29 NOTE — Telephone Encounter (Addendum)
Mr Derrick Andrade calling back.  Pt has a new pt appt in Jan, but will try to get him in sooner

## 2020-10-29 NOTE — Telephone Encounter (Signed)
Can we get pt an earlier appt?  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 Pt needs Child psychotherapist, and some help. Please advise. The number is [phone].  Pt cannot wait until Jan.  His friend willianm graves is willing to help.  Should pt come and wait for appt?

## 2020-10-30 NOTE — Telephone Encounter (Signed)
Returned call to friend and rescheduled patient appointment for 11/05/20.

## 2020-11-05 ENCOUNTER — Encounter: Payer: Self-pay | Admitting: Critical Care Medicine

## 2020-11-05 ENCOUNTER — Ambulatory Visit: Payer: Self-pay | Admitting: Family Medicine

## 2020-11-05 ENCOUNTER — Ambulatory Visit: Payer: Self-pay | Attending: Internal Medicine | Admitting: Critical Care Medicine

## 2020-11-05 ENCOUNTER — Other Ambulatory Visit: Payer: Self-pay | Admitting: Critical Care Medicine

## 2020-11-05 ENCOUNTER — Other Ambulatory Visit: Payer: Self-pay

## 2020-11-05 VITALS — BP 193/96 | HR 67 | Temp 98.1°F | Ht 72.01 in | Wt 155.4 lb

## 2020-11-05 DIAGNOSIS — B351 Tinea unguium: Secondary | ICD-10-CM | POA: Insufficient documentation

## 2020-11-05 DIAGNOSIS — K029 Dental caries, unspecified: Secondary | ICD-10-CM

## 2020-11-05 DIAGNOSIS — Z1159 Encounter for screening for other viral diseases: Secondary | ICD-10-CM

## 2020-11-05 DIAGNOSIS — H5462 Unqualified visual loss, left eye, normal vision right eye: Secondary | ICD-10-CM

## 2020-11-05 DIAGNOSIS — R0989 Other specified symptoms and signs involving the circulatory and respiratory systems: Secondary | ICD-10-CM | POA: Insufficient documentation

## 2020-11-05 DIAGNOSIS — K056 Periodontal disease, unspecified: Secondary | ICD-10-CM

## 2020-11-05 DIAGNOSIS — Z720 Tobacco use: Secondary | ICD-10-CM | POA: Insufficient documentation

## 2020-11-05 DIAGNOSIS — I10 Essential (primary) hypertension: Secondary | ICD-10-CM

## 2020-11-05 DIAGNOSIS — R7309 Other abnormal glucose: Secondary | ICD-10-CM

## 2020-11-05 DIAGNOSIS — Z23 Encounter for immunization: Secondary | ICD-10-CM

## 2020-11-05 DIAGNOSIS — Z1211 Encounter for screening for malignant neoplasm of colon: Secondary | ICD-10-CM

## 2020-11-05 LAB — POCT GLYCOSYLATED HEMOGLOBIN (HGB A1C): Hemoglobin A1C: 5 % (ref 4.0–5.6)

## 2020-11-05 LAB — GLUCOSE, POCT (MANUAL RESULT ENTRY): POC Glucose: 138 mg/dl — AB (ref 70–99)

## 2020-11-05 MED ORDER — BUPROPION HCL ER (SR) 150 MG PO TB12: 150.0000 mg | ORAL_TABLET | Freq: Every day | ORAL | 2 refills | Status: DC

## 2020-11-05 MED ORDER — AMLODIPINE BESYLATE 10 MG PO TABS: 10.0000 mg | ORAL_TABLET | Freq: Every day | ORAL | 3 refills | Status: DC

## 2020-11-05 MED ORDER — LOSARTAN POTASSIUM-HCTZ 100-12.5 MG PO TABS: 1.0000 | ORAL_TABLET | Freq: Every day | ORAL | 3 refills | Status: DC

## 2020-11-05 MED FILL — AMLODIPINE BESYLATE 10 MG T: 10 | 30 days supply | Qty: 30 | Fill #0

## 2020-11-05 MED FILL — BUPROPION SR 150 MG TABLET: 150 | 30 days supply | Qty: 30 | Fill #0

## 2020-11-05 MED FILL — LOSARTAN-HCTZ 100-12.5 MG T: 100-12.5 | 30 days supply | Qty: 30 | Fill #0

## 2020-11-05 NOTE — Patient Instructions (Addendum)
Begin losartan HCT 1 daily Begin amlodipine 1 daily  Both of the above medications are for blood pressure  Please focus on stopping smoking and begin bupropion 1 tablet daily to stop smoking, also see information on diet changes to reduce blood pressue  We will also get you connected with our financial counselor for the orange/blue card and [place] financial assistance  A flu vaccine was given, and a Tdap was given  A stool kit will be given to screen for colon cancer  Labs today:   We will check your kidney liver function blood counts and screen you for hepatitis C   A chest xray will be obtained   A referral to podiatry will be made  Hypertension, Adult High blood pressure (hypertension) is when the force of blood pumping through the arteries is too strong. The arteries are the blood vessels that carry blood from the heart throughout the body. Hypertension forces the heart to work harder to pump blood and may cause arteries to become narrow or stiff. Untreated or uncontrolled hypertension can cause a heart attack, heart failure, a stroke, kidney disease, and other problems. A blood pressure reading consists of a higher number over a lower number. Ideally, your blood pressure should be below 120/80. The first ("top") number is called the systolic pressure. It is a measure of the pressure in your arteries as your heart beats. The second ("bottom") number is called the diastolic pressure. It is a measure of the pressure in your arteries as the heart relaxes. What are the causes? The exact cause of this condition is not known. There are some conditions that result in or are related to high blood pressure. What increases the risk? Some risk factors for high blood pressure are under your control. The following factors may make you more likely to develop this condition:  Smoking.  Having type 2 diabetes mellitus, high cholesterol, or both.  Not getting enough exercise or physical  activity.  Being overweight.  Having too much fat, sugar, calories, or salt (sodium) in your diet.  Drinking too much alcohol. Some risk factors for high blood pressure may be difficult or impossible to change. Some of these factors include:  Having chronic kidney disease.  Having a family history of high blood pressure.  Age. Risk increases with age.  Race. You may be at higher risk if you are African American.  Gender. Men are at higher risk than women before age 74. After age 94, women are at higher risk than men.  Having obstructive sleep apnea.  Stress. What are the signs or symptoms? High blood pressure may not cause symptoms. Very high blood pressure (hypertensive crisis) may cause:  Headache.  Anxiety.  Shortness of breath.  Nosebleed.  Nausea and vomiting.  Vision changes.  Severe chest pain.  Seizures. How is this diagnosed? This condition is diagnosed by measuring your blood pressure while you are seated, with your arm resting on a flat surface, your legs uncrossed, and your feet flat on the floor. The cuff of the blood pressure monitor will be placed directly against the skin of your upper arm at the level of your heart. It should be measured at least twice using the same arm. Certain conditions can cause a difference in blood pressure between your right and left arms. Certain factors can cause blood pressure readings to be lower or higher than normal for a short period of time:  When your blood pressure is higher when you are in  a health care provider's office than when you are at home, this is called white coat hypertension. Most people with this condition do not need medicines.  When your blood pressure is higher at home than when you are in a health care provider's office, this is called masked hypertension. Most people with this condition may need medicines to control blood pressure. If you have a high blood pressure reading during one visit or you have  normal blood pressure with other risk factors, you may be asked to:  Return on a different day to have your blood pressure checked again.  Monitor your blood pressure at home for 1 week or longer. If you are diagnosed with hypertension, you may have other blood or imaging tests to help your health care provider understand your overall risk for other conditions. How is this treated? This condition is treated by making healthy lifestyle changes, such as eating healthy foods, exercising more, and reducing your alcohol intake. Your health care provider may prescribe medicine if lifestyle changes are not enough to get your blood pressure under control, and if:  Your systolic blood pressure is above 130.  Your diastolic blood pressure is above 80. Your personal target blood pressure may vary depending on your medical conditions, your age, and other factors. Follow these instructions at home: Eating and drinking   Eat a diet that is high in fiber and potassium, and low in sodium, added sugar, and fat. An example eating plan is called the DASH (Dietary Approaches to Stop Hypertension) diet. To eat this way: ? Eat plenty of fresh fruits and vegetables. Try to fill one half of your plate at each meal with fruits and vegetables. ? Eat whole grains, such as whole-wheat pasta, brown rice, or whole-grain bread. Fill about one fourth of your plate with whole grains. ? Eat or drink low-fat dairy products, such as skim milk or low-fat yogurt. ? Avoid fatty cuts of meat, processed or cured meats, and poultry with skin. Fill about one fourth of your plate with lean proteins, such as fish, chicken without skin, beans, eggs, or tofu. ? Avoid pre-made and processed foods. These tend to be higher in sodium, added sugar, and fat.  Reduce your daily sodium intake. Most people with hypertension should eat less than 1,500 mg of sodium a day.  Do not drink alcohol if: ? Your health care provider tells you not to  drink. ? You are pregnant, may be pregnant, or are planning to become pregnant.  If you drink alcohol: ? Limit how much you use to:  0-1 drink a day for women.  0-2 drinks a day for men. ? Be aware of how much alcohol is in your drink. In the [place] one drink equals one 12 oz bottle of beer (355 mL), one 5 oz glass of wine (148 mL), or one 1 oz glass of hard liquor (44 mL). Lifestyle   Work with your health care provider to maintain a healthy body weight or to lose weight. Ask what an ideal weight is for you.  Get at least 30 minutes of exercise most days of the week. Activities may include walking, swimming, or biking.  Include exercise to strengthen your muscles (resistance exercise), such as Pilates or lifting weights, as part of your weekly exercise routine. Try to do these types of exercises for 30 minutes at least 3 days a week.  Do not use any products that contain nicotine or tobacco, such as cigarettes, e-cigarettes, and chewing tobacco.  If you need help quitting, ask your health care provider.  Monitor your blood pressure at home as told by your health care provider.  Keep all follow-up visits as told by your health care provider. This is important. Medicines  Take over-the-counter and prescription medicines only as told by your health care provider. Follow directions carefully. Blood pressure medicines must be taken as prescribed.  Do not skip doses of blood pressure medicine. Doing this puts you at risk for problems and can make the medicine less effective.  Ask your health care provider about side effects or reactions to medicines that you should watch for. Contact a health care provider if you:  Think you are having a reaction to a medicine you are taking.  Have headaches that keep coming back (recurring).  Feel dizzy.  Have swelling in your ankles.  Have trouble with your vision. Get help right away if you:  Develop a severe headache or confusion.  Have  unusual weakness or numbness.  Feel faint.  Have severe pain in your chest or abdomen.  Vomit repeatedly.  Have trouble breathing. Summary  Hypertension is when the force of blood pumping through your arteries is too strong. If this condition is not controlled, it may put you at risk for serious complications.  Your personal target blood pressure may vary depending on your medical conditions, your age, and other factors. For most people, a normal blood pressure is less than 120/80.  Hypertension is treated with lifestyle changes, medicines, or a combination of both. Lifestyle changes include losing weight, eating a healthy, low-sodium diet, exercising more, and limiting alcohol. This information is not intended to replace advice given to you by your health care provider. Make sure you discuss any questions you have with your health care provider. Document Revised: 08/11/2018 Document Reviewed: 08/11/2018 Elsevier Patient Education  [employer].   Please consider getting your Covid booster shot now see below for locations where you can receive this COVID-19 Vaccine Information can be found at: [email or web address] For questions related to vaccine distribution or appointments, please email [email or web address] or call [phone].   A referral to an eye doctor will be made, please see information that we gave you and make an appointment at the [facility] eye clinic in [facility] with Dr. Truman Hayward   Coping with Quitting Smoking  Quitting smoking is a physical and mental challenge. You will face cravings, withdrawal symptoms, and temptation. Before quitting, work with your health care provider to make a plan that can help you cope. Preparation can help you quit and keep you from giving in. How can I cope with cravings? Cravings usually last for 5-10 minutes. If you get through it, the craving will pass. Consider taking the  following actions to help you cope with cravings:  Keep your mouth busy: ? Chew sugar-free gum. ? Suck on hard candies or a straw. ? Brush your teeth.  Keep your hands and body busy: ? Immediately change to a different activity when you feel a craving. ? Squeeze or play with a ball. ? Do an activity or a hobby, like making bead jewelry, practicing needlepoint, or working with wood. ? Mix up your normal routine. ? Take a short exercise break. Go for a quick walk or run up and down stairs. ? Spend time in public places where smoking is not allowed.  Focus on doing something kind or helpful for someone else.  Call a friend or family member to talk during a craving.  Join a support group.  Call a quit line, such as 1-800-QUIT-NOW.  Talk with your health care provider about medicines that might help you cope with cravings and make quitting easier for you. How can I deal with withdrawal symptoms? Your body may experience negative effects as it tries to get used to not having nicotine in the system. These effects are called withdrawal symptoms. They may include:  Feeling hungrier than normal.  Trouble concentrating.  Irritability.  Trouble sleeping.  Feeling depressed.  Restlessness and agitation.  Craving a cigarette. To manage withdrawal symptoms:  Avoid places, people, and activities that trigger your cravings.  Remember why you want to quit.  Get plenty of sleep.  Avoid coffee and other caffeinated drinks. These may worsen some of your symptoms. How can I handle social situations? Social situations can be difficult when you are quitting smoking, especially in the first few weeks. To manage this, you can:  Avoid parties, bars, and other social situations where people might be smoking.  Avoid alcohol.  Leave right away if you have the urge to smoke.  Explain to your family and friends that you are quitting smoking. Ask for understanding and support.  Plan  activities with friends or family where smoking is not an option. What are some ways I can cope with stress? Wanting to smoke may cause stress, and stress can make you want to smoke. Find ways to manage your stress. Relaxation techniques can help. For example:  Breathe slowly and deeply, in through your nose and out through your mouth.  Listen to soothing, relaxing music.  Talk with a family member or friend about your stress.  Light a candle.  Soak in a bath or take a shower.  Think about a peaceful place. What are some ways I can prevent weight gain? Be aware that many people gain weight after they quit smoking. However, not everyone does. To keep from gaining weight, have a plan in place before you quit and stick to the plan after you quit. Your plan should include:  Having healthy snacks. When you have a craving, it may help to: ? Eat plain popcorn, crunchy carrots, celery, or other cut vegetables. ? Chew sugar-free gum.  Changing how you eat: ? Eat small portion sizes at meals. ? Eat 4-6 small meals throughout the day instead of 1-2 large meals a day. ? Be mindful when you eat. Do not watch television or do other things that might distract you as you eat.  Exercising regularly: ? Make time to exercise each day. If you do not have time for a long workout, do short bouts of exercise for 5-10 minutes several times a day. ? Do some form of strengthening exercise, like weight lifting, and some form of aerobic exercise, like running or swimming.  Drinking plenty of water or other low-calorie or no-calorie drinks. Drink 6-8 glasses of water daily, or as much as instructed by your health care provider. Summary  Quitting smoking is a physical and mental challenge. You will face cravings, withdrawal symptoms, and temptation to smoke again. Preparation can help you as you go through these challenges.  You can cope with cravings by keeping your mouth busy (such as by chewing gum),  keeping your body and hands busy, and making calls to family, friends, or a helpline for people who want to quit smoking.  You can cope with withdrawal symptoms by avoiding places where people smoke, avoiding drinks with caffeine, and getting plenty of rest.  Ask your health care provider about the different ways to prevent weight gain, avoid stress, and handle social situations. This information is not intended to replace advice given to you by your health care provider. Make sure you discuss any questions you have with your health care provider. Document Revised: 11/13/2017 Document Reviewed: 11/28/2016 Elsevier Patient Education  2020 [employer].

## 2020-11-05 NOTE — Progress Notes (Signed)
Establish care Has general body pain level 10 due to constant pain Upset that he is blind in right eye ready to return to work

## 2020-11-05 NOTE — Assessment & Plan Note (Signed)
As per dental caries assessment 

## 2020-11-05 NOTE — Progress Notes (Signed)
Subjective:    Patient ID: Derrick Andrade, male    DOB: 17-Oct-1961, 59 y.o.   MRN: [id number]  This is a pleasant but complex male who is worked into the clinic today to establish primary care and is a 59 year old male.  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  He also wants a general medical screening.  He complains he does fatigue easily and has mid back pain and rib pain.  The patient drinks 2 beers a day and is still actively smoking 5-6 cigarettes daily.  On arrival note blood pressure is 193/96.  He has no prior known history of hypertension.  On arrival blood sugar is 138 with a hemoglobin A1c of 5.0  Note this patient does not have any insurance at this time  Note this patient did receive the full Covid vaccine series of [place] with the last vaccine in April and he knows he needs a booster shot   The patient also complains of calluses on the feet and ingrown toenails   Past Medical History:  Diagnosis Date  . Perirectal abscess 09/06/2019  . Wrist fracture    left     Family History  Family history unknown: Yes     Social History   Socioeconomic History  . Marital status: Single    Spouse name: Not on file  . Number of children: Not on file  . Years of education: Not on file  . Highest education level: Not on file  Occupational History  . Not on file  Tobacco Use  . Smoking status: Current Every Day Smoker    Packs/day: 0.50  . Smokeless tobacco: Never Used  . Tobacco comment: 5-6 cigarettes daily  Substance and Sexual Activity  . Alcohol use: Yes    Alcohol/week: 1.0 - 2.0 standard drink    Types: 1 - 2 Cans of beer per week    Comment: 1-2  beers daily  . Drug use: No  . Sexual activity: Not on file  Other Topics Concern  . Not on file  Social History Narrative  . Not on file   Social Determinants of Health   Financial Resource Strain:   . Difficulty of Paying Living Expenses: Not on file  Food Insecurity:   . Worried About Charity fundraiser in the Last Year: Not on file  . Ran Out of Food in the Last Year: Not on file  Transportation Needs:   . Lack of Transportation (Medical): Not on file  . Lack of Transportation (Non-Medical): Not on file  Physical Activity:   . Days of Exercise per Week: Not on file  . Minutes of Exercise per Session: Not on file  Stress:   . Feeling of Stress : Not on file  Social Connections:   . Frequency of Communication with Friends and Family: Not on file  . Frequency of Social Gatherings with Friends and Family: Not on file  . Attends Religious Services: Not on file  . Active Member of Clubs or Organizations: Not on file  . Attends Archivist Meetings: Not on file  . Marital Status: Not on file  Intimate Partner Violence:   .  Fear of Current or Ex-Partner: Not on file  . Emotionally Abused: Not on file  . Physically Abused: Not on file  . Sexually Abused: Not on file     No Known Allergies   Outpatient Medications Prior to Visit  Medication Sig Dispense Refill  . acetaminophen (TYLENOL) 325 MG tablet Take 2 tablets (650 mg total) by mouth every 6 (six) hours as needed for mild pain (or temp > 100).    . cefdinir (OMNICEF) 300 MG capsule Take 2 capsules (600 mg total) by mouth daily. (Patient not taking: Reported on 11/05/2020) 20 capsule 0  . docusate sodium (COLACE) 100 MG capsule Take 1 capsule (100 mg total) by mouth daily as needed for mild constipation. (Patient not taking: Reported on 11/05/2020)    . ibuprofen (MOTRIN IB) 200 MG tablet Take 2 tablets (400 mg total) by mouth every 6 (six) hours as needed for moderate pain. (Patient not taking: Reported on  11/05/2020)    . polyethylene glycol (MIRALAX / GLYCOLAX) 17 g packet Take 17 g by mouth daily as needed for mild constipation. (Patient not taking: Reported on 11/05/2020)     No facility-administered medications prior to visit.      Review of Systems  Constitutional: Negative.   HENT: Negative.   Eyes: Positive for pain and visual disturbance.       No vision in right eye from trauma.  Left eye worse and throbs with sun out  Respiratory: Positive for shortness of breath. Negative for cough, choking and wheezing.   Cardiovascular: Positive for chest pain. Negative for palpitations and leg swelling.  Gastrointestinal: Negative.   Genitourinary: Negative.   Musculoskeletal: Positive for back pain.  Neurological: Negative for tremors, seizures and weakness.       Objective:   Physical Exam Vitals:   11/05/20 1105  BP: (!) 193/96  Pulse: 67  Temp: 98.1 F (36.7 C)  SpO2: 97%  Weight: 155 lb 6.4 oz (70.5 kg)  Height: 6' 0.01" (1.829 m)    Gen: Pleasant, thin, in no distress,  normal affect  ENT: No lesions,  mouth clear,  oropharynx clear, no postnasal drip poor dentition with multiple carious teeth and periodontal disease The right eye shows evidence of prior vitrectomy with dilated iris I cannot fully examine the left eye he will need to have that I dilated  Neck: No JVD, no TMG, no carotid bruits  Lungs: No use of accessory muscles, no dullness to percussion, rhonchi noted in both bases of the lungs  Cardiovascular: RRR, heart sounds normal, no murmur or gallops, no peripheral edema  Abdomen: soft and NT, no HSM,  BS normal  Musculoskeletal: No deformities, no cyanosis or clubbing Severe onychomycosis of both great toenails seen callus formation medial aspect base of both great toes  Neuro: alert, non focal  Skin: Warm, no lesions or rashes       Assessment & Plan:  I personally reviewed all images and lab data in the [facility] system as well as any outside  material available during this office visit and agree with the  radiology impressions.   Primary hypertension Hypertension newly diagnosed and discerned  Plan will be to begin losartan HCT 100/12.5 daily and amlodipine 10 mg daily  Proper diet was discussed  Will obtain metabolic profile and complete blood count  Patient will return for short-term follow-up 1 month  Dental caries Severe dental caries and also periodontal disease  We'll connect this patient with our financial counselor so he  can obtain the orange card and then obtain dental referral from there  Periodontal disease As per dental caries assessment  Onychomycosis Bilateral callus formation and also severe onychomycosis of both great toenails of both feet  Rhonchi at both lung bases Significant rhonchi at both bases of the lungs will obtain chest x-ray  Tobacco use    . Current smoking consumption amount: 5 to 6 cigarettes daily  . Dicsussion on advise to quit smoking and smoking impacts: Impacts on cardiovascular lung health  . Patient's willingness to quit: Wants to quit  . Methods to quit smoking discussed: Nicotine replacement therapy is not indicated due to hypertension I recommended bupropion and behavioral modification  . Medication management of smoking session drugs discussed: Bupropion was recommended  . Resources provided:  AVS   . Setting quit date not yet established  . Follow-up arranged 1 month   Time spent counseling the patient: 5 minutes   Decreased vision of left eye History of trauma to the right eye with complete vitrectomy and subsequent complete loss of vision in the right eye now with declining vision on the left unclear etiology  Urgent referral made to optometry for further evaluation of the left eye   Gavinn was seen today for establish care.  Diagnoses and all orders for this visit:  Primary hypertension -     Comprehensive metabolic panel -     CBC with  Differential/Platelet  Abnormal glucose -     POCT glucose (manual entry) -     POCT glycosylated hemoglobin (Hb A1C)  Onychomycosis -     Ambulatory referral to Podiatry  Need for hepatitis C screening test -     HCV Ab w/Rflx to Verification  Colon cancer screening -     Fecal occult blood, imunochemical  Tobacco use -     DG Chest 2 View; Future  Rhonchi at both lung bases -     DG Chest 2 View; Future  Need for immunization against influenza -     Flu Vaccine QUAD 36+ mos IM  Dental caries  Periodontal disease  Decreased vision of left eye  Other orders -     amLODipine (NORVASC) 10 MG tablet; Take 1 tablet (10 mg total) by mouth daily. -     losartan-hydrochlorothiazide (HYZAAR) 100-12.5 MG tablet; Take 1 tablet by mouth daily. -     buPROPion (WELLBUTRIN SR) 150 MG 12 hr tablet; Take 1 tablet (150 mg total) by mouth daily.   Tetanus and flu vaccine was administered this visit  The patient knows to get the booster Covid vaccine he is now due  Hepatitis C assay will be obtained fecal occult kit given to patient for colon cancer screening

## 2020-11-05 NOTE — Assessment & Plan Note (Signed)
Bilateral callus formation and also severe onychomycosis of both great toenails of both feet

## 2020-11-05 NOTE — Assessment & Plan Note (Signed)
History of trauma to the right eye with complete vitrectomy and subsequent complete loss of vision in the right eye now with declining vision on the left unclear etiology  Urgent referral made to optometry for further evaluation of the left eye

## 2020-11-05 NOTE — Assessment & Plan Note (Signed)
Significant rhonchi at both bases of the lungs will obtain chest x-ray

## 2020-11-05 NOTE — Assessment & Plan Note (Signed)
Severe dental caries and also periodontal disease  We'll connect this patient with our financial counselor so he can obtain the orange card and then obtain dental referral from there

## 2020-11-05 NOTE — Assessment & Plan Note (Signed)
° ° °•   Current smoking consumption amount: 5 to 6 cigarettes daily   Dicsussion on advise to quit smoking and smoking impacts: Impacts on cardiovascular lung health   Patient's willingness to quit: Wants to quit   Methods to quit smoking discussed: Nicotine replacement therapy is not indicated due to hypertension I recommended bupropion and behavioral modification   Medication management of smoking session drugs discussed: Bupropion was recommended   Resources provided:  AVS    Setting quit date not yet established   Follow-up arranged 1 month   Time spent counseling the patient: 5 minutes

## 2020-11-05 NOTE — Assessment & Plan Note (Addendum)
Hypertension newly diagnosed and discerned  Plan will be to begin losartan HCT 100/12.5 daily and amlodipine 10 mg daily  Proper diet was discussed  Will obtain metabolic profile and complete blood count  Patient will return for short-term follow-up 1 month

## 2020-11-06 ENCOUNTER — Telehealth: Payer: Self-pay | Admitting: Critical Care Medicine

## 2020-11-06 LAB — COMPREHENSIVE METABOLIC PANEL
ALT: 12 IU/L (ref 0–44)
AST: 21 IU/L (ref 0–40)
Albumin/Globulin Ratio: 1.2 (ref 1.2–2.2)
Albumin: 4.1 g/dL (ref 3.8–4.9)
Alkaline Phosphatase: 84 IU/L (ref 44–121)
BUN/Creatinine Ratio: 9 (ref 9–20)
BUN: 11 mg/dL (ref 6–24)
Bilirubin Total: 0.7 mg/dL (ref 0.0–1.2)
CO2: 23 mmol/L (ref 20–29)
Calcium: 9.7 mg/dL (ref 8.7–10.2)
Chloride: 103 mmol/L (ref 96–106)
Creatinine, Ser: 1.21 mg/dL (ref 0.76–1.27)
GFR calc Af Amer: 76 mL/min/{1.73_m2} (ref >59)
GFR calc non Af Amer: 66 mL/min/{1.73_m2} (ref >59)
Globulin, Total: 3.5 g/dL (ref 1.5–4.5)
Glucose: 83 mg/dL (ref 65–99)
Potassium: 4.6 mmol/L (ref 3.5–5.2)
Sodium: 143 mmol/L (ref 134–144)
Total Protein: 7.6 g/dL (ref 6.0–8.5)

## 2020-11-06 LAB — CBC WITH DIFFERENTIAL/PLATELET
Basophils Absolute: 0 10*3/uL (ref 0.0–0.2)
Basos: 0 %
EOS (ABSOLUTE): 0.1 10*3/uL (ref 0.0–0.4)
Eos: 1 %
Hematocrit: 35.9 % — ABNORMAL LOW (ref 37.5–51.0)
Hemoglobin: 11.5 g/dL — ABNORMAL LOW (ref 13.0–17.7)
Immature Grans (Abs): 0 10*3/uL (ref 0.0–0.1)
Immature Granulocytes: 0 %
Lymphocytes Absolute: 1.3 10*3/uL (ref 0.7–3.1)
Lymphs: 22 %
MCH: 26.6 pg (ref 26.6–33.0)
MCHC: 32 g/dL (ref 31.5–35.7)
MCV: 83 fL (ref 79–97)
Monocytes Absolute: 0.6 10*3/uL (ref 0.1–0.9)
Monocytes: 10 %
Neutrophils Absolute: 3.8 10*3/uL (ref 1.4–7.0)
Neutrophils: 67 %
Platelets: 252 10*3/uL (ref 150–450)
RBC: 4.33 x10E6/uL (ref 4.14–5.80)
RDW: 13.5 % (ref 11.6–15.4)
WBC: 5.8 10*3/uL (ref 3.4–10.8)

## 2020-11-06 LAB — HCV AB W/RFLX TO VERIFICATION: HCV Ab: 0.2 s/co ratio (ref 0.0–0.9)

## 2020-11-06 LAB — HCV INTERPRETATION

## 2020-11-06 NOTE — Telephone Encounter (Signed)
Patient is returning a call to schedule a financial counseling appt.  Patient stated he could not get to the phone in time.  Please call patient back.

## 2020-11-06 NOTE — Telephone Encounter (Signed)
I return Pt call, schedule a financial appt for 11/16/20

## 2020-11-06 NOTE — Telephone Encounter (Signed)
Called patient and LVM to schedule financial counseling appointment next week.

## 2020-11-16 ENCOUNTER — Ambulatory Visit: Payer: Self-pay | Attending: Critical Care Medicine

## 2020-11-16 ENCOUNTER — Other Ambulatory Visit: Payer: Self-pay

## 2020-11-23 ENCOUNTER — Ambulatory Visit: Payer: Self-pay | Admitting: Podiatry

## 2020-11-29 ENCOUNTER — Telehealth: Payer: Self-pay | Admitting: Critical Care Medicine

## 2020-11-29 NOTE — Telephone Encounter (Signed)
I return Pt call, LVM to call us back 

## 2020-11-29 NOTE — Telephone Encounter (Signed)
Pt friend will graves is calling and would like carlos to call pt sister her name is gail call back number [phone]. Pt does have IRS information that carlos requested. Pt need to make an appt with carlos

## 2020-12-02 NOTE — Progress Notes (Signed)
Subjective:    Patient ID: Derrick Andrade, male    DOB: 04/25/1961, 59 y.o.   MRN: [id number]  This is a pleasant but complex male who is worked into the clinic today to establish primary care and is a 59 year old male.  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  He also wants a general medical screening.  He complains he does fatigue easily and has mid back pain and rib pain.  The patient drinks 2 beers a day and is still actively smoking 5-6 cigarettes daily.  On arrival note blood pressure is 193/96.  He has no prior known history of hypertension.  On arrival blood sugar is 138 with a hemoglobin A1c of 5.0  Note this patient does not have any insurance at this time  Note this patient did receive the full Covid vaccine series of [employer] with the last vaccine in April and he knows he needs a booster shot   The patient also complains of calluses on the feet and ingrown toenails  12/03/20 Patient returns today for follow-up of blood pressure on arrival blood pressure is 147/72 which is markedly improved from prior values  Patient's primary complaint is that he feels he needs disability and wishes for [place] to issue that to him he did not understand he has to go to the disability determination office to make an application first  He is compliant with his losartan HCT and amlodipine The patient is yet to obtain his chest x-ray  Patient complains of low back pain at this visit  Past Medical History:  Diagnosis Date  . Perirectal abscess 09/06/2019  . Wrist fracture    left     Family History  Family history unknown: Yes     Social History   Socioeconomic History  .  Marital status: Single    Spouse name: Not on file  . Number of children: Not on file  . Years of education: Not on file  . Highest education level: Not on file  Occupational History  . Not on file  Tobacco Use  . Smoking status: Current Every Day Smoker    Packs/day: 0.50  . Smokeless tobacco: Never Used  . Tobacco comment: 5-6 cigarettes daily  Substance and Sexual Activity  . Alcohol use: Yes    Alcohol/week: 1.0 - 2.0 standard drink    Types: 1 - 2 Cans of beer per week    Comment: 1-2 beers daily  . Drug use: No  . Sexual activity: Not on file  Other Topics Concern  . Not on file  Social History Narrative  . Not on file   Social Determinants of Health   Financial Resource Strain: Not on file  Food Insecurity: Not on file  Transportation Needs: Not on file  Physical Activity: Not on file  Stress: Not on file  Social Connections: Not on file  Intimate Partner Violence: Not on file     No Known Allergies   Outpatient Medications Prior to Visit  Medication Sig Dispense Refill  . acetaminophen (TYLENOL) 325 MG tablet Take 2 tablets (650 mg total) by mouth every 6 (six) hours as needed for mild pain (  or temp > 100).    Marland Kitchen amLODipine (NORVASC) 10 MG tablet Take 1 tablet (10 mg total) by mouth daily. 90 tablet 3  . losartan-hydrochlorothiazide (HYZAAR) 100-12.5 MG tablet Take 1 tablet by mouth daily. 90 tablet 3  . buPROPion (WELLBUTRIN SR) 150 MG 12 hr tablet Take 1 tablet (150 mg total) by mouth daily. 30 tablet 2   No facility-administered medications prior to visit.      Review of Systems  Constitutional: Negative.   HENT: Negative.   Eyes: Positive for pain and visual disturbance.       No vision in right eye from trauma.  Left eye worse and throbs with sun out  Respiratory: Positive for shortness of breath. Negative for cough, choking and wheezing.   Cardiovascular: Positive for chest pain. Negative for palpitations and leg swelling.  Gastrointestinal:  Negative.   Genitourinary: Negative.   Musculoskeletal: Positive for back pain.  Neurological: Negative for tremors, seizures and weakness.       Objective:   Physical Exam Vitals:   12/03/20 1118  BP: (!) 147/72  Pulse: 72  SpO2: 99%  Weight: 160 lb (72.6 kg)  Height: 6' (1.829 m)    Gen: Pleasant, thin, in no distress,  normal affect  ENT: No lesions,  mouth clear,  oropharynx clear, no postnasal drip poor dentition with multiple carious teeth and periodontal disease The right eye shows evidence of prior vitrectomy with dilated iris I cannot fully examine the left eye   Neck: No JVD, no TMG, no carotid bruits  Lungs: No use of accessory muscles, no dullness to percussion, rhonchi noted in both bases of the lungs  Cardiovascular: RRR, heart sounds normal, no murmur or gallops, no peripheral edema  Abdomen: soft and NT, no HSM,  BS normal  Musculoskeletal: No deformities, no cyanosis or clubbing Severe onychomycosis of both great toenails seen callus formation medial aspect base of both great toes  Neuro: alert, non focal  Skin: Warm, no lesions or rashes       Assessment & Plan:  I personally reviewed all images and lab data in the [facility] system as well as any outside material available during this office visit and agree with the  radiology impressions.   Primary hypertension Hypertension with improved control continue to work on smoking cessation continue current medications  Chronic bilateral low back pain without sciatica Chronic bilateral low back pain without radiation  Obtain x-rays of the lower back  Tylenol 3 and methocarbamol sent to the [facility] outpatient pharmacy under the [employer] nurse find  Rhonchi at both lung bases Continued coarse breath sounds will obtain chest x-ray  Tobacco use    . Current smoking consumption amount: 5 to 6 cigarettes daily  . Dicsussion on advise to quit smoking and smoking impacts: Impacts on cardiovascular  lung health  . Patient's willingness to quit: Wants to quit  . Methods to quit smoking discussed: Nicotine replacement therapy is not indicated due to hypertension I recommended bupropion and behavioral modification... I refilled the bupropion this visit  . Medication management of smoking session drugs discussed: Bupropion was recommended  . Resources provided:  AVS   . Setting quit date not yet established  . Follow-up arranged 1 month   Time spent counseling the patient: 5 minutes    Sahid was seen today for hypertension.  Diagnoses and all orders for this visit:  Primary hypertension  Colon cancer screening -     Fecal occult blood, imunochemical  Rhonchi  at both lung bases -     DG Chest 2 View; Future  Tobacco use -     DG Chest 2 View; Future  Chronic bilateral low back pain without sciatica -     DG Lumbar Spine Complete; Future  Other orders -     buPROPion (WELLBUTRIN SR) 150 MG 12 hr tablet; Take 1 tablet (150 mg total) by mouth daily. -     methocarbamol (ROBAXIN) 500 MG tablet; Take 1 tablet (500 mg total) by mouth 4 (four) times daily. -     acetaminophen-codeine (TYLENOL #3) 300-30 MG tablet; Take 1-2 tablets by mouth every 6 (six) hours as needed for moderate pain.

## 2020-12-03 ENCOUNTER — Encounter: Payer: Self-pay | Admitting: Critical Care Medicine

## 2020-12-03 ENCOUNTER — Ambulatory Visit: Payer: Self-pay | Attending: Critical Care Medicine | Admitting: Critical Care Medicine

## 2020-12-03 ENCOUNTER — Other Ambulatory Visit: Payer: Self-pay | Admitting: Critical Care Medicine

## 2020-12-03 ENCOUNTER — Other Ambulatory Visit: Payer: Self-pay

## 2020-12-03 ENCOUNTER — Ambulatory Visit: Payer: Self-pay | Admitting: Critical Care Medicine

## 2020-12-03 VITALS — BP 147/72 | HR 72 | Ht 72.0 in | Wt 160.0 lb

## 2020-12-03 DIAGNOSIS — Z1211 Encounter for screening for malignant neoplasm of colon: Secondary | ICD-10-CM

## 2020-12-03 DIAGNOSIS — I10 Essential (primary) hypertension: Secondary | ICD-10-CM

## 2020-12-03 DIAGNOSIS — M545 Low back pain, unspecified: Secondary | ICD-10-CM

## 2020-12-03 DIAGNOSIS — Z720 Tobacco use: Secondary | ICD-10-CM

## 2020-12-03 DIAGNOSIS — G8929 Other chronic pain: Secondary | ICD-10-CM

## 2020-12-03 DIAGNOSIS — R0989 Other specified symptoms and signs involving the circulatory and respiratory systems: Secondary | ICD-10-CM

## 2020-12-03 MED ORDER — ACETAMINOPHEN-CODEINE #3 300-30 MG PO TABS: 1.0000 | ORAL_TABLET | Freq: Four times a day (QID) | ORAL | 0 refills | Status: DC | PRN

## 2020-12-03 MED ORDER — BUPROPION HCL ER (SR) 150 MG PO TB12: 150.0000 mg | ORAL_TABLET | Freq: Every day | ORAL | 2 refills | Status: DC

## 2020-12-03 MED ORDER — METHOCARBAMOL 500 MG PO TABS
500.0000 mg | ORAL_TABLET | Freq: Four times a day (QID) | ORAL | 1 refills | Status: DC
Start: 2020-12-03 — End: 2021-02-04

## 2020-12-03 MED FILL — ACETAMINOPHEN-COD #3 TABLET: 300-30 | 4 days supply | Qty: 30 | Fill #0

## 2020-12-03 MED FILL — METHOCARBAMOL 500 MG TABS: 500 | 22 days supply | Qty: 90 | Fill #0

## 2020-12-03 MED FILL — BUPROPION SR 150 MG TABLET: 150 | 30 days supply | Qty: 30 | Fill #0

## 2020-12-03 NOTE — Assessment & Plan Note (Signed)
Continued coarse breath sounds will obtain chest x-ray

## 2020-12-03 NOTE — Assessment & Plan Note (Addendum)
Chronic bilateral low back pain without radiation  Obtain x-rays of the lower back  Tylenol 3 and methocarbamol sent to the [facility] outpatient pharmacy under the [employer] nurse find

## 2020-12-03 NOTE — Patient Instructions (Signed)
Begin methocarbamol 1 tablet 4 times a day as needed for low back pain and spasm  Take Tylenol 3 1 to 2 tablets every 8 hours as needed for low back pain  Refills on Wellbutrin sent to the pharmacy  All of your medication refills were sent to the [facility] outpatient pharmacy which is at [address] by the lab and pick up a fecal occult kit obtain a sample mail [place] back in  Please obtain your Covid vaccine as you stated you will obtain tomorrow as a booster  No change in your blood pressure medications  Return to see Dr. Joya Gaskins in 2 months  In order to obtain disability you will have to go to the disability determination office which is at the [place] office to obtain an application they then will send [place] a request for records

## 2020-12-03 NOTE — Assessment & Plan Note (Addendum)
  .   Current smoking consumption amount: 5 to 6 cigarettes daily  . Dicsussion on advise to quit smoking and smoking impacts: Impacts on cardiovascular lung health  . Patient's willingness to quit: Wants to quit  . Methods to quit smoking discussed: Nicotine replacement therapy is not indicated due to hypertension I recommended bupropion and behavioral modification... I refilled the bupropion this visit  . Medication management of smoking session drugs discussed: Bupropion was recommended  . Resources provided:  AVS   . Setting quit date not yet established  . Follow-up arranged 1 month   Time spent counseling the patient: 5 minutes  

## 2020-12-03 NOTE — Assessment & Plan Note (Signed)
Hypertension with improved control continue to work on smoking cessation continue current medications

## 2020-12-12 ENCOUNTER — Ambulatory Visit: Payer: Self-pay

## 2020-12-27 ENCOUNTER — Ambulatory Visit: Payer: Self-pay | Admitting: Internal Medicine

## 2021-01-15 ENCOUNTER — Other Ambulatory Visit: Payer: Self-pay

## 2021-01-15 ENCOUNTER — Ambulatory Visit: Payer: Self-pay | Attending: Critical Care Medicine

## 2021-01-16 MED FILL — LOSARTAN-HCTZ 100-12.5 MG T: 100-12.5 | 30 days supply | Qty: 30 | Fill #1

## 2021-01-16 MED FILL — AMLODIPINE BESYLATE 10 MG T: 10 | 30 days supply | Qty: 30 | Fill #1

## 2021-02-01 NOTE — Progress Notes (Signed)
Subjective:    Patient ID: Derrick Andrade, male    DOB: 05-May-1961, 60 y.o.   MRN: [id number]  This is a pleasant but complex male who is worked into the clinic today to establish primary care and is a 60 year old male.  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  He also wants a general medical screening.  He complains he does fatigue easily and has mid back pain and rib pain.  The patient drinks 2 beers a day and is still actively smoking 5-6 cigarettes daily.  On arrival note blood pressure is 193/96.  He has no prior known history of hypertension.  On arrival blood sugar is 138 with a hemoglobin A1c of 5.0  Note this patient does not have any insurance at this time  Note this patient did receive the full Covid vaccine series of [place] with the last vaccine in April and he knows he needs a booster shot   The patient also complains of calluses on the feet and ingrown toenails  12/03/20 Patient returns today for follow-up of blood pressure on arrival blood pressure is 147/72 which is markedly improved from prior values  Patient's primary complaint is that he feels he needs disability and wishes for [place] to issue that to him he did not understand he has to go to the disability determination office to make an application first  He is compliant with his losartan HCT and amlodipine The patient is yet to obtain his chest x-ray  Patient complains of low back pain at this visit  02/04/21 Patient is seen in return follow-up and has been off his blood pressure medicines for several days on arrival blood pressure is 177/81.  Patient smoking about 4 cigarettes daily.  He  drinks about 1 to 2 cans of beer daily as well.  Patient continues to complain of low back pain and right leg pain.  He had been compliant with his losartan HCT and amlodipine and is obtaining refills today.  Patient is yet to obtain a chest x-ray and lumbar spine films as requested.  He is yet to see a dentist for his periodontal and dental cavity disease He is using the bupropion to help with smoking cessation He has been compliant with Tylenol 3 needed for back pain    Past Medical History:  Diagnosis Date  . Perirectal abscess 09/06/2019  . Wrist fracture    left     Family History  Family history unknown: Yes     Social History   Socioeconomic History  . Marital status: Single    Spouse name: Not on file  . Number of children: Not on file  . Years of education: Not on file  . Highest education level: Not on file  Occupational History  . Not on file  Tobacco Use  . Smoking status: Current Every Day Smoker    Packs/day: 0.50  . Smokeless tobacco: Never Used  . Tobacco comment: 5-6 cigarettes daily  Substance and Sexual Activity  . Alcohol use: Yes    Alcohol/week: 1.0 - 2.0 standard drink    Types: 1 - 2 Cans of beer per week  Comment: 1-2 beers daily  . Drug use: No  . Sexual activity: Not on file  Other Topics Concern  . Not on file  Social History Narrative  . Not on file   Social Determinants of Health   Financial Resource Strain: Not on file  Food Insecurity: Not on file  Transportation Needs: Not on file  Physical Activity: Not on file  Stress: Not on file  Social Connections: Not on file  Intimate Partner Violence: Not on file     No Known Allergies   Outpatient Medications Prior to Visit  Medication Sig Dispense Refill  . acetaminophen (TYLENOL) 325 MG tablet Take 2 tablets (650 mg total) by mouth every 6 (six) hours as needed for mild pain (or temp > 100).    Marland Kitchen acetaminophen-codeine (TYLENOL #3) 300-30 MG tablet Take 1-2 tablets by mouth  every 6 (six) hours as needed for moderate pain. 30 tablet 0  . amLODipine (NORVASC) 10 MG tablet Take 1 tablet (10 mg total) by mouth daily. 90 tablet 3  . buPROPion (WELLBUTRIN SR) 150 MG 12 hr tablet Take 1 tablet (150 mg total) by mouth daily. 30 tablet 2  . losartan-hydrochlorothiazide (HYZAAR) 100-12.5 MG tablet Take 1 tablet by mouth daily. 90 tablet 3  . methocarbamol (ROBAXIN) 500 MG tablet Take 1 tablet (500 mg total) by mouth 4 (four) times daily. 90 tablet 1   No facility-administered medications prior to visit.      Review of Systems  Constitutional: Negative.   HENT: Negative.   Eyes: Positive for pain and visual disturbance.       No vision in right eye from trauma.  Left eye worse and throbs with sun out  Respiratory: Positive for shortness of breath. Negative for cough, choking and wheezing.   Cardiovascular: Positive for chest pain. Negative for palpitations and leg swelling.  Gastrointestinal: Negative.   Genitourinary: Negative.   Musculoskeletal: Positive for back pain.  Neurological: Negative for tremors, seizures and weakness.       Objective:   Physical Exam  Vitals:   02/04/21 1019  BP: (!) 177/81  Pulse: 67  SpO2: 98%  Weight: 162 lb 12.8 oz (73.8 kg)  Height: 6' (1.829 m)    Gen: Pleasant, thin, in no distress,  normal affect  ENT: No lesions,  mouth clear,  oropharynx clear, no postnasal drip poor dentition with multiple carious teeth and periodontal disease The right eye shows evidence of prior vitrectomy with dilated iris I cannot fully examine the left eye   Neck: No JVD, no TMG, no carotid bruits  Lungs: No use of accessory muscles, no dullness to percussion, rhonchi noted in both bases of the lungs  Cardiovascular: RRR, heart sounds normal, no murmur or gallops, no peripheral edema  Abdomen: soft and NT, no HSM,  BS normal  Musculoskeletal: No deformities, no cyanosis or clubbing Severe onychomycosis of both great toenails seen  callus formation medial aspect base of both great toes  Neuro: alert, non focal  Skin: Warm, no lesions or rashes       Assessment & Plan:  I personally reviewed all images and lab data in the [facility] system as well as any outside material available during this office visit and agree with the  radiology impressions.   Chronic bilateral low back pain without sciatica Bilateral back pain we will refill the Tylenol 3 and Robaxin for now I have encouraged the patient to get the lumbar spine series as it request  Periodontal disease  Again have recommended and furnish dental resources for this patient to obtain a dental exam  Rhonchi at both lung bases Still has evidence of rhonchi and adventitious sounds on the lungs I requested a chest x-ray for him he is yet to obtain he said he will follow through on this  Tobacco use    . Current smoking consumption amount: 5 to 6 cigarettes daily  . Dicsussion on advise to quit smoking and smoking impacts: Impacts on cardiovascular lung health  . Patient's willingness to quit: Wants to quit  . Methods to quit smoking discussed: Nicotine replacement therapy is not indicated due to hypertension I recommended bupropion and behavioral modification... I refilled the bupropion this visit  . Medication management of smoking session drugs discussed: Bupropion was recommended  . Resources provided:  AVS   . Setting quit date not yet established  . Follow-up arranged 1 month   Time spent counseling the patient: 5 minutes    Issam was seen today for hypertension.  Diagnoses and all orders for this visit:  Chronic bilateral low back pain without sciatica  Periodontal disease  Rhonchi at both lung bases  Tobacco use  Other orders -     methocarbamol (ROBAXIN) 500 MG tablet; Take 1 tablet (500 mg total) by mouth 4 (four) times daily. -     buPROPion (WELLBUTRIN SR) 150 MG 12 hr tablet; Take 1 tablet (150 mg total) by mouth daily. -      acetaminophen-codeine (TYLENOL #3) 300-30 MG tablet; Take 1-2 tablets by mouth every 6 (six) hours as needed for moderate pain. -     amLODipine (NORVASC) 10 MG tablet; Take 1 tablet (10 mg total) by mouth daily. -     losartan-hydrochlorothiazide (HYZAAR) 100-12.5 MG tablet; Take 1 tablet by mouth daily.   Patient is yet to complete his colon cancer screening he is reminded to process the kit he was given and bring it in for evaluations

## 2021-02-04 ENCOUNTER — Other Ambulatory Visit: Payer: Self-pay

## 2021-02-04 ENCOUNTER — Other Ambulatory Visit: Payer: Self-pay | Admitting: Critical Care Medicine

## 2021-02-04 ENCOUNTER — Encounter: Payer: Self-pay | Admitting: Critical Care Medicine

## 2021-02-04 ENCOUNTER — Ambulatory Visit: Payer: Self-pay | Attending: Critical Care Medicine | Admitting: Critical Care Medicine

## 2021-02-04 DIAGNOSIS — M545 Low back pain, unspecified: Secondary | ICD-10-CM

## 2021-02-04 DIAGNOSIS — Z720 Tobacco use: Secondary | ICD-10-CM

## 2021-02-04 DIAGNOSIS — K056 Periodontal disease, unspecified: Secondary | ICD-10-CM

## 2021-02-04 DIAGNOSIS — G8929 Other chronic pain: Secondary | ICD-10-CM

## 2021-02-04 DIAGNOSIS — R0989 Other specified symptoms and signs involving the circulatory and respiratory systems: Secondary | ICD-10-CM

## 2021-02-04 MED ORDER — ACETAMINOPHEN-CODEINE #3 300-30 MG PO TABS: 1.0000 | ORAL_TABLET | Freq: Four times a day (QID) | ORAL | 0 refills | Status: DC | PRN

## 2021-02-04 MED ORDER — AMLODIPINE BESYLATE 10 MG PO TABS: 10.0000 mg | ORAL_TABLET | Freq: Every day | ORAL | 3 refills | Status: DC

## 2021-02-04 MED ORDER — BUPROPION HCL ER (SR) 150 MG PO TB12: 150.0000 mg | ORAL_TABLET | Freq: Every day | ORAL | 2 refills | Status: DC

## 2021-02-04 MED ORDER — LOSARTAN POTASSIUM-HCTZ 100-12.5 MG PO TABS: 1.0000 | ORAL_TABLET | Freq: Every day | ORAL | 3 refills | Status: DC

## 2021-02-04 MED ORDER — METHOCARBAMOL 500 MG PO TABS: 500.0000 mg | ORAL_TABLET | Freq: Four times a day (QID) | ORAL | 1 refills | Status: DC

## 2021-02-04 MED FILL — LOSARTAN-HCTZ 100-12.5 MG T: 100-12.5 | 30 days supply | Qty: 30 | Fill #1

## 2021-02-04 MED FILL — AMLODIPINE BESYLATE 10 MG T: 10 | 30 days supply | Qty: 30 | Fill #1

## 2021-02-04 NOTE — Assessment & Plan Note (Signed)
Still has evidence of rhonchi and adventitious sounds on the lungs I requested a chest x-ray for him he is yet to obtain he said he will follow through on this

## 2021-02-04 NOTE — Assessment & Plan Note (Signed)
Again have recommended and furnish dental resources for this patient to obtain a dental exam

## 2021-02-04 NOTE — Patient Instructions (Signed)
Encouraged to go to [facility] Radiology for a chest x-ray and X-ray of the lumbar spine  Refills on BP medications Return to see Franky Macho, clinical pharmacist, in 2wks  for BP check Return to see Dr. wright 76month

## 2021-02-04 NOTE — Assessment & Plan Note (Signed)
Bilateral back pain we will refill the Tylenol 3 and Robaxin for now I have encouraged the patient to get the lumbar spine series as it request

## 2021-02-04 NOTE — Assessment & Plan Note (Signed)
  .   Current smoking consumption amount: 5 to 6 cigarettes daily  . Dicsussion on advise to quit smoking and smoking impacts: Impacts on cardiovascular lung health  . Patient's willingness to quit: Wants to quit  . Methods to quit smoking discussed: Nicotine replacement therapy is not indicated due to hypertension I recommended bupropion and behavioral modification... I refilled the bupropion this visit  . Medication management of smoking session drugs discussed: Bupropion was recommended  . Resources provided:  AVS   . Setting quit date not yet established  . Follow-up arranged 1 month   Time spent counseling the patient: 5 minutes

## 2021-02-18 ENCOUNTER — Ambulatory Visit: Payer: Self-pay | Admitting: Pharmacist

## 2021-03-01 ENCOUNTER — Telehealth: Payer: Self-pay | Admitting: Critical Care Medicine

## 2021-03-01 NOTE — Telephone Encounter (Signed)
Called patient and LVM advising that his upcoming appointment on 4/28 had been cancelled due to the provider being out of the office. Advised patient to call [phone] to schedule.

## 2021-04-11 ENCOUNTER — Ambulatory Visit: Payer: Self-pay | Admitting: Critical Care Medicine

## 2021-04-16 ENCOUNTER — Other Ambulatory Visit: Payer: Self-pay

## 2021-04-16 MED FILL — Losartan Potassium & Hydrochlorothiazide Tab 100-12.5 MG: ORAL | 30 days supply | Qty: 30 | Fill #0 | Status: AC

## 2021-04-16 MED FILL — Amlodipine Besylate Tab 10 MG (Base Equivalent): ORAL | 30 days supply | Qty: 30 | Fill #0 | Status: AC

## 2021-04-17 ENCOUNTER — Other Ambulatory Visit: Payer: Self-pay

## 2021-06-13 ENCOUNTER — Other Ambulatory Visit (HOSPITAL_COMMUNITY): Payer: Self-pay

## 2021-11-06 ENCOUNTER — Other Ambulatory Visit: Payer: Self-pay

## 2021-11-06 MED FILL — Bupropion HCl Tab ER 12HR 150 MG: ORAL | 30 days supply | Qty: 30 | Fill #0 | Status: AC

## 2021-11-06 MED FILL — Amlodipine Besylate Tab 10 MG (Base Equivalent): ORAL | 30 days supply | Qty: 30 | Fill #1 | Status: AC

## 2021-11-06 MED FILL — Losartan Potassium & Hydrochlorothiazide Tab 100-12.5 MG: ORAL | 30 days supply | Qty: 30 | Fill #1 | Status: AC

## 2021-11-11 ENCOUNTER — Other Ambulatory Visit: Payer: Self-pay

## 2022-02-07 ENCOUNTER — Inpatient Hospital Stay (HOSPITAL_COMMUNITY): Payer: Self-pay

## 2022-02-07 ENCOUNTER — Encounter (HOSPITAL_COMMUNITY)
Admission: EM | Disposition: A | Discharge status: Home Health | Payer: Self-pay | Source: Home / Self Care | Attending: Internal Medicine

## 2022-02-07 ENCOUNTER — Inpatient Hospital Stay (HOSPITAL_COMMUNITY): Payer: Self-pay | Admitting: Certified Registered Nurse Anesthetist

## 2022-02-07 ENCOUNTER — Other Ambulatory Visit: Payer: Self-pay

## 2022-02-07 ENCOUNTER — Encounter (HOSPITAL_COMMUNITY): Payer: Self-pay

## 2022-02-07 ENCOUNTER — Emergency Department (HOSPITAL_COMMUNITY): Payer: Self-pay

## 2022-02-07 ENCOUNTER — Inpatient Hospital Stay (HOSPITAL_COMMUNITY)
Admission: EM | Admit: 2022-02-07 | Discharge: 2022-02-12 | DRG: 481 | Disposition: A | Discharge status: Home Health | Payer: Self-pay | Attending: Internal Medicine | Admitting: Internal Medicine

## 2022-02-07 DIAGNOSIS — F17210 Nicotine dependence, cigarettes, uncomplicated: Secondary | ICD-10-CM | POA: Diagnosis present

## 2022-02-07 DIAGNOSIS — I10 Essential (primary) hypertension: Secondary | ICD-10-CM

## 2022-02-07 DIAGNOSIS — Y908 Blood alcohol level of 240 mg/100 ml or more: Secondary | ICD-10-CM | POA: Diagnosis present

## 2022-02-07 DIAGNOSIS — M25561 Pain in right knee: Secondary | ICD-10-CM | POA: Diagnosis present

## 2022-02-07 DIAGNOSIS — S72141A Displaced intertrochanteric fracture of right femur, initial encounter for closed fracture: Secondary | ICD-10-CM

## 2022-02-07 DIAGNOSIS — W010XXA Fall on same level from slipping, tripping and stumbling without subsequent striking against object, initial encounter: Secondary | ICD-10-CM | POA: Diagnosis present

## 2022-02-07 DIAGNOSIS — S72001A Fracture of unspecified part of neck of right femur, initial encounter for closed fracture: Secondary | ICD-10-CM

## 2022-02-07 DIAGNOSIS — F1020 Alcohol dependence, uncomplicated: Secondary | ICD-10-CM | POA: Diagnosis present

## 2022-02-07 DIAGNOSIS — Z79899 Other long term (current) drug therapy: Secondary | ICD-10-CM

## 2022-02-07 DIAGNOSIS — R269 Unspecified abnormalities of gait and mobility: Secondary | ICD-10-CM | POA: Diagnosis present

## 2022-02-07 DIAGNOSIS — Z20822 Contact with and (suspected) exposure to covid-19: Secondary | ICD-10-CM | POA: Diagnosis present

## 2022-02-07 DIAGNOSIS — E559 Vitamin D deficiency, unspecified: Secondary | ICD-10-CM | POA: Diagnosis present

## 2022-02-07 DIAGNOSIS — S72009A Fracture of unspecified part of neck of unspecified femur, initial encounter for closed fracture: Secondary | ICD-10-CM

## 2022-02-07 DIAGNOSIS — D62 Acute posthemorrhagic anemia: Secondary | ICD-10-CM | POA: Diagnosis not present

## 2022-02-07 DIAGNOSIS — T148XXA Other injury of unspecified body region, initial encounter: Secondary | ICD-10-CM

## 2022-02-07 DIAGNOSIS — Z720 Tobacco use: Secondary | ICD-10-CM

## 2022-02-07 DIAGNOSIS — Z419 Encounter for procedure for purposes other than remedying health state, unspecified: Secondary | ICD-10-CM

## 2022-02-07 DIAGNOSIS — F10221 Alcohol dependence with intoxication delirium: Secondary | ICD-10-CM

## 2022-02-07 HISTORY — PX: INTRAMEDULLARY (IM) NAIL INTERTROCHANTERIC: SHX5875

## 2022-02-07 HISTORY — DX: Nicotine dependence, unspecified, uncomplicated: F17.200

## 2022-02-07 HISTORY — DX: Alcohol dependence, uncomplicated: F10.20

## 2022-02-07 HISTORY — DX: Essential (primary) hypertension: I10

## 2022-02-07 LAB — CBC WITH DIFFERENTIAL/PLATELET
Abs Immature Granulocytes: 0.08 10*3/uL — ABNORMAL HIGH (ref 0.00–0.07)
Basophils Absolute: 0 10*3/uL (ref 0.0–0.1)
Basophils Relative: 0 %
Eosinophils Absolute: 0 10*3/uL (ref 0.0–0.5)
Eosinophils Relative: 0 %
HCT: 31.7 % — ABNORMAL LOW (ref 39.0–52.0)
Hemoglobin: 10.1 g/dL — ABNORMAL LOW (ref 13.0–17.0)
Immature Granulocytes: 1 %
Lymphocytes Relative: 16 %
Lymphs Abs: 1.4 10*3/uL (ref 0.7–4.0)
MCH: 25.8 pg — ABNORMAL LOW (ref 26.0–34.0)
MCHC: 31.9 g/dL (ref 30.0–36.0)
MCV: 80.9 fL (ref 80.0–100.0)
Monocytes Absolute: 0.4 10*3/uL (ref 0.1–1.0)
Monocytes Relative: 5 %
Neutro Abs: 6.7 10*3/uL (ref 1.7–7.7)
Neutrophils Relative %: 78 %
Platelets: 318 10*3/uL (ref 150–400)
RBC: 3.92 MIL/uL — ABNORMAL LOW (ref 4.22–5.81)
RDW: 13.6 % (ref 11.5–15.5)
WBC: 8.6 10*3/uL (ref 4.0–10.5)
nRBC: 0 % (ref 0.0–0.2)

## 2022-02-07 LAB — BASIC METABOLIC PANEL
Anion gap: 12 (ref 5–15)
BUN: 6 mg/dL (ref 6–20)
CO2: 22 mmol/L (ref 22–32)
Calcium: 8.4 mg/dL — ABNORMAL LOW (ref 8.9–10.3)
Chloride: 106 mmol/L (ref 98–111)
Creatinine, Ser: 0.86 mg/dL (ref 0.61–1.24)
GFR, Estimated: >60 mL/min (ref >60)
Glucose, Bld: 109 mg/dL — ABNORMAL HIGH (ref 70–99)
Potassium: 3.1 mmol/L — ABNORMAL LOW (ref 3.5–5.1)
Sodium: 140 mmol/L (ref 135–145)

## 2022-02-07 LAB — RESP PANEL BY RT-PCR (FLU A&B, COVID) ARPGX2
Influenza A by PCR: NEGATIVE
Influenza B by PCR: NEGATIVE
SARS Coronavirus 2 by RT PCR: NEGATIVE

## 2022-02-07 LAB — SURGICAL PCR SCREEN
MRSA, PCR: NEGATIVE
Staphylococcus aureus: NEGATIVE

## 2022-02-07 LAB — ETHANOL: Alcohol, Ethyl (B): 258 mg/dL — ABNORMAL HIGH (ref <10)

## 2022-02-07 LAB — ABO/RH: ABO/RH(D): B POS

## 2022-02-07 LAB — TYPE AND SCREEN
ABO/RH(D): B POS
Antibody Screen: NEGATIVE

## 2022-02-07 SURGERY — FIXATION, FRACTURE, INTERTROCHANTERIC, WITH INTRAMEDULLARY ROD
Anesthesia: Monitor Anesthesia Care | Site: Leg Upper | Laterality: Right

## 2022-02-07 MED ORDER — HYDROMORPHONE HCL 1 MG/ML IJ SOLN: 0.2500 mg | INTRAMUSCULAR | Status: DC | PRN

## 2022-02-07 MED ORDER — ACETAMINOPHEN 500 MG PO TABS
1000.0000 mg | ORAL_TABLET | Freq: Once | ORAL | Status: AC
  Administered 2022-02-07: 1000 mg via ORAL
  Filled 2022-02-07: qty 2

## 2022-02-07 MED ORDER — THIAMINE HCL 100 MG PO TABS
100.0000 mg | ORAL_TABLET | Freq: Every day | ORAL | Status: DC
  Administered 2022-02-08 – 2022-02-12 (×5): 100 mg via ORAL
  Filled 2022-02-07 (×5): qty 1

## 2022-02-07 MED ORDER — CHLORHEXIDINE GLUCONATE 4 % EX LIQD: 60.0000 mL | Freq: Once | CUTANEOUS | Status: DC

## 2022-02-07 MED ORDER — FENTANYL CITRATE (PF) 250 MCG/5ML IJ SOLN
INTRAMUSCULAR | Status: DC | PRN
  Administered 2022-02-07 (×2): 50 ug via INTRAVENOUS

## 2022-02-07 MED ORDER — NICOTINE 14 MG/24HR TD PT24
14.0000 mg | MEDICATED_PATCH | Freq: Every day | TRANSDERMAL | Status: DC
  Administered 2022-02-07 – 2022-02-12 (×6): 14 mg via TRANSDERMAL
  Filled 2022-02-07 (×6): qty 1

## 2022-02-07 MED ORDER — 0.9 % SODIUM CHLORIDE (POUR BTL) OPTIME
TOPICAL | Status: DC | PRN
  Administered 2022-02-07: 1000 mL

## 2022-02-07 MED ORDER — CEFAZOLIN SODIUM-DEXTROSE 2-4 GM/100ML-% IV SOLN
2.0000 g | Freq: Three times a day (TID) | INTRAVENOUS | Status: AC
  Administered 2022-02-08 (×3): 2 g via INTRAVENOUS
  Filled 2022-02-07 (×3): qty 100

## 2022-02-07 MED ORDER — METHOCARBAMOL 1000 MG/10ML IJ SOLN
500.0000 mg | Freq: Four times a day (QID) | INTRAVENOUS | Status: DC | PRN
  Filled 2022-02-07 (×2): qty 5

## 2022-02-07 MED ORDER — PROPOFOL 10 MG/ML IV BOLUS
INTRAVENOUS | Status: DC | PRN
  Administered 2022-02-07: 30 mg via INTRAVENOUS

## 2022-02-07 MED ORDER — CHLORHEXIDINE GLUCONATE 0.12 % MT SOLN
OROMUCOSAL | Status: AC
Start: 2022-02-07 — End: 2022-02-07
  Administered 2022-02-07: 15 mL
  Filled 2022-02-07: qty 15

## 2022-02-07 MED ORDER — ONDANSETRON HCL 4 MG/2ML IJ SOLN: 4.0000 mg | Freq: Four times a day (QID) | INTRAMUSCULAR | Status: DC | PRN

## 2022-02-07 MED ORDER — AMLODIPINE BESYLATE 10 MG PO TABS
10.0000 mg | ORAL_TABLET | Freq: Every day | ORAL | Status: DC
  Administered 2022-02-08 – 2022-02-12 (×5): 10 mg via ORAL
  Filled 2022-02-07 (×5): qty 1

## 2022-02-07 MED ORDER — THIAMINE HCL 100 MG/ML IJ SOLN
100.0000 mg | Freq: Every day | INTRAMUSCULAR | Status: DC
  Administered 2022-02-07: 100 mg via INTRAVENOUS
  Filled 2022-02-07 (×2): qty 2

## 2022-02-07 MED ORDER — KETOROLAC TROMETHAMINE 15 MG/ML IJ SOLN
15.0000 mg | Freq: Once | INTRAMUSCULAR | Status: AC
  Administered 2022-02-07: 15 mg via INTRAVENOUS
  Filled 2022-02-07: qty 1

## 2022-02-07 MED ORDER — POLYETHYLENE GLYCOL 3350 17 G PO PACK
17.0000 g | PACK | Freq: Every day | ORAL | Status: DC | PRN
  Administered 2022-02-11: 17 g via ORAL
  Filled 2022-02-07: qty 1

## 2022-02-07 MED ORDER — LORAZEPAM 2 MG/ML IJ SOLN: 0.0000 mg | Freq: Two times a day (BID) | INTRAMUSCULAR | Status: DC

## 2022-02-07 MED ORDER — LORAZEPAM 2 MG/ML IJ SOLN
0.0000 mg | Freq: Four times a day (QID) | INTRAMUSCULAR | Status: DC
  Administered 2022-02-07: 2 mg via INTRAVENOUS
  Filled 2022-02-07: qty 1

## 2022-02-07 MED ORDER — ONDANSETRON HCL 4 MG PO TABS: 4.0000 mg | ORAL_TABLET | Freq: Four times a day (QID) | ORAL | Status: DC | PRN

## 2022-02-07 MED ORDER — OXYCODONE HCL 5 MG/5ML PO SOLN: 5.0000 mg | Freq: Once | ORAL | Status: DC | PRN

## 2022-02-07 MED ORDER — DIPHENHYDRAMINE HCL 12.5 MG/5ML PO ELIX: 12.5000 mg | ORAL_SOLUTION | ORAL | Status: DC | PRN

## 2022-02-07 MED ORDER — TRANEXAMIC ACID-NACL 1000-0.7 MG/100ML-% IV SOLN
1000.0000 mg | Freq: Once | INTRAVENOUS | Status: AC
  Administered 2022-02-07: 1000 mg via INTRAVENOUS
  Filled 2022-02-07: qty 100

## 2022-02-07 MED ORDER — OXYCODONE HCL 5 MG PO TABS
2.5000 mg | ORAL_TABLET | Freq: Once | ORAL | Status: AC
  Administered 2022-02-07: 2.5 mg via ORAL
  Filled 2022-02-07: qty 1

## 2022-02-07 MED ORDER — MIDAZOLAM HCL 2 MG/2ML IJ SOLN
INTRAMUSCULAR | Status: AC
  Filled 2022-02-07: qty 2

## 2022-02-07 MED ORDER — FENTANYL CITRATE (PF) 250 MCG/5ML IJ SOLN
INTRAMUSCULAR | Status: AC
  Filled 2022-02-07: qty 5

## 2022-02-07 MED ORDER — BUPIVACAINE IN DEXTROSE 0.75-8.25 % IT SOLN
INTRATHECAL | Status: DC | PRN
  Administered 2022-02-07: 1.6 mL via INTRATHECAL

## 2022-02-07 MED ORDER — POVIDONE-IODINE 10 % EX SWAB
2.0000 "application " | Freq: Once | CUTANEOUS | Status: AC
  Administered 2022-02-07: 2 via TOPICAL

## 2022-02-07 MED ORDER — LOSARTAN POTASSIUM-HCTZ 100-12.5 MG PO TABS: 1.0000 | ORAL_TABLET | Freq: Every day | ORAL | Status: DC

## 2022-02-07 MED ORDER — OXYCODONE HCL 5 MG PO TABS
5.0000 mg | ORAL_TABLET | ORAL | Status: DC | PRN
  Administered 2022-02-07 – 2022-02-12 (×12): 10 mg via ORAL
  Filled 2022-02-07 (×12): qty 2

## 2022-02-07 MED ORDER — DOCUSATE SODIUM 100 MG PO CAPS
100.0000 mg | ORAL_CAPSULE | Freq: Two times a day (BID) | ORAL | Status: DC
  Administered 2022-02-07: 100 mg via ORAL
  Filled 2022-02-07: qty 1

## 2022-02-07 MED ORDER — THIAMINE HCL 100 MG/ML IJ SOLN: 100.0000 mg | Freq: Every day | INTRAMUSCULAR | Status: DC

## 2022-02-07 MED ORDER — ADULT MULTIVITAMIN W/MINERALS CH
1.0000 | ORAL_TABLET | Freq: Every day | ORAL | Status: DC
  Administered 2022-02-07 – 2022-02-12 (×6): 1 via ORAL
  Filled 2022-02-07 (×6): qty 1

## 2022-02-07 MED ORDER — LORAZEPAM 1 MG PO TABS: 1.0000 mg | ORAL_TABLET | ORAL | Status: AC | PRN

## 2022-02-07 MED ORDER — BUPROPION HCL ER (SR) 150 MG PO TB12
150.0000 mg | ORAL_TABLET | Freq: Every day | ORAL | Status: DC
  Administered 2022-02-08 – 2022-02-12 (×5): 150 mg via ORAL
  Filled 2022-02-07 (×5): qty 1

## 2022-02-07 MED ORDER — LORAZEPAM 2 MG/ML IJ SOLN
0.0000 mg | Freq: Four times a day (QID) | INTRAMUSCULAR | Status: AC
  Administered 2022-02-08: 2 mg via INTRAVENOUS
  Filled 2022-02-07: qty 1

## 2022-02-07 MED ORDER — MORPHINE SULFATE (PF) 2 MG/ML IV SOLN: 2.0000 mg | INTRAVENOUS | Status: DC | PRN

## 2022-02-07 MED ORDER — METOCLOPRAMIDE HCL 5 MG PO TABS: 5.0000 mg | ORAL_TABLET | Freq: Three times a day (TID) | ORAL | Status: DC | PRN

## 2022-02-07 MED ORDER — METOCLOPRAMIDE HCL 5 MG/ML IJ SOLN: 5.0000 mg | Freq: Three times a day (TID) | INTRAMUSCULAR | Status: DC | PRN

## 2022-02-07 MED ORDER — CEFAZOLIN SODIUM-DEXTROSE 2-4 GM/100ML-% IV SOLN
2.0000 g | INTRAVENOUS | Status: AC
  Administered 2022-02-07: 2 g via INTRAVENOUS
  Filled 2022-02-07: qty 100

## 2022-02-07 MED ORDER — PROPOFOL 500 MG/50ML IV EMUL
INTRAVENOUS | Status: DC | PRN
Start: 2022-02-07 — End: 2022-02-07
  Administered 2022-02-07: 75 ug/kg/min via INTRAVENOUS

## 2022-02-07 MED ORDER — OXYCODONE HCL 5 MG PO TABS: 5.0000 mg | ORAL_TABLET | Freq: Once | ORAL | Status: DC | PRN

## 2022-02-07 MED ORDER — ENOXAPARIN SODIUM 40 MG/0.4ML IJ SOSY
40.0000 mg | PREFILLED_SYRINGE | INTRAMUSCULAR | Status: DC
  Administered 2022-02-08 – 2022-02-12 (×5): 40 mg via SUBCUTANEOUS
  Filled 2022-02-07 (×5): qty 0.4

## 2022-02-07 MED ORDER — LIDOCAINE 2% (20 MG/ML) 5 ML SYRINGE
INTRAMUSCULAR | Status: DC | PRN
  Administered 2022-02-07: 60 mg via INTRAVENOUS

## 2022-02-07 MED ORDER — THIAMINE HCL 100 MG PO TABS: 100.0000 mg | ORAL_TABLET | Freq: Every day | ORAL | Status: DC

## 2022-02-07 MED ORDER — ONDANSETRON HCL 4 MG/2ML IJ SOLN: 4.0000 mg | Freq: Once | INTRAMUSCULAR | Status: DC | PRN

## 2022-02-07 MED ORDER — LORAZEPAM 2 MG/ML IJ SOLN
1.0000 mg | INTRAMUSCULAR | Status: AC | PRN
  Administered 2022-02-08: 2 mg via INTRAVENOUS
  Filled 2022-02-07: qty 1

## 2022-02-07 MED ORDER — MORPHINE SULFATE (PF) 2 MG/ML IV SOLN: 1.0000 mg | INTRAVENOUS | Status: DC | PRN

## 2022-02-07 MED ORDER — OXYCODONE HCL 5 MG PO TABS: 5.0000 mg | ORAL_TABLET | ORAL | Status: DC | PRN

## 2022-02-07 MED ORDER — LORAZEPAM 1 MG PO TABS: 0.0000 mg | ORAL_TABLET | Freq: Four times a day (QID) | ORAL | Status: DC

## 2022-02-07 MED ORDER — PHENYLEPHRINE HCL-NACL 20-0.9 MG/250ML-% IV SOLN
INTRAVENOUS | Status: DC | PRN
  Administered 2022-02-07: 10 ug/min via INTRAVENOUS

## 2022-02-07 MED ORDER — METHOCARBAMOL 500 MG PO TABS
500.0000 mg | ORAL_TABLET | Freq: Four times a day (QID) | ORAL | Status: DC | PRN
  Administered 2022-02-08 – 2022-02-12 (×7): 500 mg via ORAL
  Filled 2022-02-07 (×8): qty 1

## 2022-02-07 MED ORDER — LOSARTAN POTASSIUM 50 MG PO TABS
100.0000 mg | ORAL_TABLET | Freq: Every day | ORAL | Status: DC
  Administered 2022-02-08 – 2022-02-12 (×5): 100 mg via ORAL
  Filled 2022-02-07 (×6): qty 2

## 2022-02-07 MED ORDER — HYDROCHLOROTHIAZIDE 12.5 MG PO TABS
12.5000 mg | ORAL_TABLET | Freq: Every day | ORAL | Status: DC
Start: 2022-02-08 — End: 2022-02-12
  Administered 2022-02-08 – 2022-02-12 (×5): 12.5 mg via ORAL
  Filled 2022-02-07 (×5): qty 1

## 2022-02-07 MED ORDER — ACETAMINOPHEN 500 MG PO TABS
1000.0000 mg | ORAL_TABLET | Freq: Four times a day (QID) | ORAL | Status: DC
  Administered 2022-02-07 – 2022-02-11 (×12): 1000 mg via ORAL
  Filled 2022-02-07 (×15): qty 2

## 2022-02-07 MED ORDER — LORAZEPAM 2 MG/ML IJ SOLN
0.0000 mg | Freq: Two times a day (BID) | INTRAMUSCULAR | Status: AC
  Administered 2022-02-09 (×2): 2 mg via INTRAVENOUS
  Filled 2022-02-07 (×2): qty 1

## 2022-02-07 MED ORDER — DOCUSATE SODIUM 100 MG PO CAPS
100.0000 mg | ORAL_CAPSULE | Freq: Two times a day (BID) | ORAL | Status: DC
  Administered 2022-02-08 – 2022-02-12 (×10): 100 mg via ORAL
  Filled 2022-02-07 (×10): qty 1

## 2022-02-07 MED ORDER — FOLIC ACID 1 MG PO TABS
1.0000 mg | ORAL_TABLET | Freq: Every day | ORAL | Status: DC
  Administered 2022-02-07 – 2022-02-12 (×6): 1 mg via ORAL
  Filled 2022-02-07 (×6): qty 1

## 2022-02-07 MED ORDER — BISACODYL 5 MG PO TBEC: 5.0000 mg | DELAYED_RELEASE_TABLET | Freq: Every day | ORAL | Status: DC | PRN

## 2022-02-07 MED ORDER — MIDAZOLAM HCL 2 MG/2ML IJ SOLN
INTRAMUSCULAR | Status: DC | PRN
Start: 2022-02-07 — End: 2022-02-07
  Administered 2022-02-07: 2 mg via INTRAVENOUS

## 2022-02-07 MED ORDER — LORAZEPAM 1 MG PO TABS: 0.0000 mg | ORAL_TABLET | Freq: Two times a day (BID) | ORAL | Status: DC

## 2022-02-07 MED ORDER — LACTATED RINGERS IV SOLN: INTRAVENOUS | Status: DC

## 2022-02-07 SURGICAL SUPPLY — 51 items
ADH SKN CLS APL DERMABOND .7 (GAUZE/BANDAGES/DRESSINGS) ×1
APL PRP STRL LF DISP 70% ISPRP (MISCELLANEOUS) ×1
BAG COUNTER SPONGE SURGICOUNT (BAG) IMPLANT
BAG SPNG CNTER NS LX DISP (BAG)
BIT DRILL INTERTAN LAG SCREW (BIT) ×1 IMPLANT
BIT DRILL LONG 4.0 (BIT) IMPLANT
BRUSH SCRUB EZ PLAIN DRY (MISCELLANEOUS) ×6 IMPLANT
CHLORAPREP W/TINT 26 (MISCELLANEOUS) ×3 IMPLANT
COVER PERINEAL POST (MISCELLANEOUS) ×3 IMPLANT
COVER SURGICAL LIGHT HANDLE (MISCELLANEOUS) ×3 IMPLANT
DERMABOND ADVANCED (GAUZE/BANDAGES/DRESSINGS) ×1
DERMABOND ADVANCED .7 DNX12 (GAUZE/BANDAGES/DRESSINGS) ×2 IMPLANT
DRAPE C-ARM 35X43 STRL (DRAPES) ×3 IMPLANT
DRAPE IMP U-DRAPE 54X76 (DRAPES) ×6 IMPLANT
DRAPE INCISE IOBAN 66X45 STRL (DRAPES) ×3 IMPLANT
DRAPE STERI IOBAN 125X83 (DRAPES) ×3 IMPLANT
DRAPE SURG 17X23 STRL (DRAPES) ×6 IMPLANT
DRAPE U-SHAPE 47X51 STRL (DRAPES) ×3 IMPLANT
DRESSING MEPILEX FLEX 4X4 (GAUZE/BANDAGES/DRESSINGS) IMPLANT
DRILL BIT LONG 4.0 (BIT) ×2
DRSG MEPILEX BORDER 4X4 (GAUZE/BANDAGES/DRESSINGS) ×3 IMPLANT
DRSG MEPILEX BORDER 4X8 (GAUZE/BANDAGES/DRESSINGS) ×3 IMPLANT
DRSG MEPILEX FLEX 4X4 (GAUZE/BANDAGES/DRESSINGS) ×2
ELECT REM PT RETURN 9FT ADLT (ELECTROSURGICAL) ×2
ELECTRODE REM PT RTRN 9FT ADLT (ELECTROSURGICAL) ×2 IMPLANT
GLOVE SURG ENC MOIS LTX SZ6.5 (GLOVE) ×9 IMPLANT
GLOVE SURG ENC MOIS LTX SZ7.5 (GLOVE) ×12 IMPLANT
GLOVE SURG UNDER POLY LF SZ6.5 (GLOVE) ×3 IMPLANT
GLOVE SURG UNDER POLY LF SZ7.5 (GLOVE) ×3 IMPLANT
GOWN STRL REUS W/ TWL LRG LVL3 (GOWN DISPOSABLE) ×2 IMPLANT
GOWN STRL REUS W/TWL LRG LVL3 (GOWN DISPOSABLE) ×2
GUIDE PIN 3.2X343 (PIN) ×2
GUIDE PIN 3.2X343MM (PIN) ×4
KIT BASIN OR (CUSTOM PROCEDURE TRAY) ×3 IMPLANT
KIT TURNOVER KIT B (KITS) ×3 IMPLANT
MANIFOLD NEPTUNE II (INSTRUMENTS) ×3 IMPLANT
NAIL INTERTAN 10X18 130D 10S (Nail) ×1 IMPLANT
NS IRRIG 1000ML POUR BTL (IV SOLUTION) ×3 IMPLANT
PACK GENERAL/GYN (CUSTOM PROCEDURE TRAY) ×3 IMPLANT
PAD ARMBOARD 7.5X6 YLW CONV (MISCELLANEOUS) ×6 IMPLANT
PIN GUIDE 3.2X343MM (PIN) IMPLANT
SCREW LAG COMPR KIT 105/100 (Screw) ×1 IMPLANT
SCREW TRIGEN LOW PROF 5.0X37.5 (Screw) ×1 IMPLANT
SUT MNCRL AB 3-0 PS2 18 (SUTURE) ×3 IMPLANT
SUT VIC AB 0 CT1 27 (SUTURE)
SUT VIC AB 0 CT1 27XBRD ANBCTR (SUTURE) IMPLANT
SUT VIC AB 2-0 CT1 27 (SUTURE) ×4
SUT VIC AB 2-0 CT1 TAPERPNT 27 (SUTURE) ×4 IMPLANT
TOWEL GREEN STERILE (TOWEL DISPOSABLE) ×6 IMPLANT
TRAY FOLEY W/BAG SLVR 14FR (SET/KITS/TRAYS/PACK) ×1 IMPLANT
WATER STERILE IRR 1000ML POUR (IV SOLUTION) ×3 IMPLANT

## 2022-02-07 NOTE — ED Provider Notes (Signed)
[facility] EMERGENCY DEPARTMENT Provider Note   CSN: [id number] Arrival date & time: 02/07/22  0138     History  Chief Complaint  Patient presents with   Derrick Andrade is a 61 y.o. male.  61 yo M with a chief complaint of right hip pain.  This occurred while he was walking.  He had been drinking today and he lost his balance and fell onto his right side.  Complaining mostly of pain to the right hip.  Has not been able to bear weight since.  Tells me that he is able to bear weight on the left leg.  He denies any other areas of trauma denies head injury denies loss consciousness denies neck pain back pain chest pain abdominal pain.  The history is provided by the patient.  Fall      Home Medications Prior to Admission medications   Medication Sig Start Date End Date Taking? Authorizing Provider  acetaminophen (TYLENOL) 325 MG tablet Take 2 tablets (650 mg total) by mouth every 6 (six) hours as needed for mild pain (or temp > 100). 09/09/19   Juliet Rude, PA-C  amLODipine (NORVASC) 10 MG tablet TAKE 1 TABLET (10 MG TOTAL) BY MOUTH DAILY. 02/04/21 02/04/22  Storm Frisk, MD  buPROPion (WELLBUTRIN SR) 150 MG 12 hr tablet TAKE 1 TABLET (150 MG TOTAL) BY MOUTH DAILY. 02/04/21 02/04/22  Storm Frisk, MD  losartan-hydrochlorothiazide (HYZAAR) 100-12.5 MG tablet TAKE 1 TABLET BY MOUTH DAILY. 02/04/21 02/04/22  Storm Frisk, MD      Allergies    Patient has no known allergies.    Review of Systems   Review of Systems  Physical Exam Updated Vital Signs BP 129/73    Pulse 66    Temp 98.4 F (36.9 C)    Resp 18    Ht 6' (1.829 m)    Wt 73.8 kg    SpO2 100%    BMI 22.07 kg/m  Physical Exam Vitals and nursing note reviewed.  Constitutional:      Appearance: He is well-developed.  HENT:     Head: Normocephalic and atraumatic.  Eyes:     Pupils: Pupils are equal, round, and reactive to light.  Neck:     Vascular: No JVD.  Cardiovascular:      Rate and Rhythm: Normal rate and regular rhythm.     Heart sounds: No murmur heard.   No friction rub. No gallop.  Pulmonary:     Effort: No respiratory distress.     Breath sounds: No wheezing.  Abdominal:     General: There is no distension.     Tenderness: There is no abdominal tenderness. There is no guarding or rebound.  Musculoskeletal:        General: Normal range of motion.     Cervical back: Normal range of motion and neck supple.     Comments: Patient is lying on his left side.  Has intact pulse motor and sensation distally to the right lower extremity.  He will spontaneously yell out and say that his right leg hurts but I am able to internally and externally rotate it without obvious pain.  No crepitus or deformity to the femur.  Skin:    Coloration: Skin is not pale.     Findings: No rash.  Neurological:     Mental Status: He is alert and oriented to person, place, and time.  Psychiatric:  Behavior: Behavior normal.    ED Results / Procedures / Treatments   Labs (all labs ordered are listed, but only abnormal results are displayed) Labs Reviewed  CBC WITH DIFFERENTIAL/PLATELET - Abnormal; Notable for the following components:      Result Value   RBC 3.92 (*)    Hemoglobin 10.1 (*)    HCT 31.7 (*)    MCH 25.8 (*)    Abs Immature Granulocytes 0.08 (*)    All other components within normal limits  BASIC METABOLIC PANEL - Abnormal; Notable for the following components:   Potassium 3.1 (*)    Glucose, Bld 109 (*)    Calcium 8.4 (*)    All other components within normal limits  ETHANOL - Abnormal; Notable for the following components:   Alcohol, Ethyl (B) 258 (*)    All other components within normal limits  RESP PANEL BY RT-PCR (FLU A&B, COVID) ARPGX2  TYPE AND SCREEN  ABO/RH    EKG EKG Interpretation  Date/Time:  Friday February 07 2022 05:44:52 EST Ventricular Rate:  64 PR Interval:  194 QRS Duration: 93 QT Interval:  449 QTC  Calculation: 464 R Axis:   85 Text Interpretation: Sinus rhythm Left ventricular hypertrophy Borderline ST elevation, lateral leads No significant change since last tracing Confirmed by Melene Plan [phone]) on 02/07/2022 5:46:41 AM  Radiology DG Hip Unilat W or Wo Pelvis 2-3 Views Right  Result Date: 02/07/2022 CLINICAL DATA:  Right hip pain EXAM: DG HIP (WITH OR WITHOUT PELVIS) 2-3V RIGHT COMPARISON:  None. FINDINGS: A intratrochanteric fracture of the right hip is present, likely acute in nature with a lucency seen within the greater trochanter and acute avulsion of the lesser trochanter noted. There is varus angulation of the right femoral neck, new since prior CT examination of 09/06/2019. No dislocation. Hip joint spaces are preserved. Limited evaluation of the left hip is unremarkable. Soft tissues are unremarkable. IMPRESSION: Suspected acute intratrochanteric fracture of the right hip with avulsion of the lesser trochanter and resultant varus angulation. This could be confirmed with CT imaging. Electronically Signed   By: Helyn Numbers M.D.   On: 02/07/2022 04:11    Procedures Procedures    Medications Ordered in ED Medications  LORazepam (ATIVAN) injection 0-4 mg (has no administration in time range)    Or  LORazepam (ATIVAN) tablet 0-4 mg (has no administration in time range)  LORazepam (ATIVAN) injection 0-4 mg (has no administration in time range)    Or  LORazepam (ATIVAN) tablet 0-4 mg (has no administration in time range)  thiamine tablet 100 mg (has no administration in time range)    Or  thiamine (B-1) injection 100 mg (has no administration in time range)  ketorolac (TORADOL) 15 MG/ML injection 15 mg (15 mg Intravenous Given 02/07/22 0243)  acetaminophen (TYLENOL) tablet 1,000 mg (1,000 mg Oral Given 02/07/22 0243)  oxyCODONE (Oxy IR/ROXICODONE) immediate release tablet 2.5 mg (2.5 mg Oral Given 02/07/22 0243)    ED Course/ Medical Decision Making/ A&P                            Medical Decision Making Amount and/or Complexity of Data Reviewed Labs: ordered. Radiology: ordered. ECG/medicine tests: ordered.  Risk OTC drugs. Prescription drug management.   61 yo M with a chief complaints of a fall while intoxicated.  Complaining mostly of right hip pain.  We will obtain a plain film of the right hip.  The patient  is unwilling to lay flat for imaging.  He is requesting something for pain prior to the imaging study being performed.  Plain film with possible intertrochanteric fx as independently interpreted by me.    No significant anemia, mild hypokalemia.  Ca mildly low.   Discussed with Dr. Carola Frost, ortho, will see this morning.   Discussed with hospitalist.    The patients results and plan were reviewed and discussed.   Any x-rays performed were independently reviewed by myself.   Differential diagnosis were considered with the presenting HPI.  Medications  LORazepam (ATIVAN) injection 0-4 mg (has no administration in time range)    Or  LORazepam (ATIVAN) tablet 0-4 mg (has no administration in time range)  LORazepam (ATIVAN) injection 0-4 mg (has no administration in time range)    Or  LORazepam (ATIVAN) tablet 0-4 mg (has no administration in time range)  thiamine tablet 100 mg (has no administration in time range)    Or  thiamine (B-1) injection 100 mg (has no administration in time range)  ketorolac (TORADOL) 15 MG/ML injection 15 mg (15 mg Intravenous Given 02/07/22 0243)  acetaminophen (TYLENOL) tablet 1,000 mg (1,000 mg Oral Given 02/07/22 0243)  oxyCODONE (Oxy IR/ROXICODONE) immediate release tablet 2.5 mg (2.5 mg Oral Given 02/07/22 0243)    Vitals:   02/07/22 0400 02/07/22 0430 02/07/22 0445 02/07/22 0500  BP: (!) 149/80 137/74 139/73 129/73  Pulse: 83 (!) 58 65 66  Resp: 18 19 17 18   Temp:      SpO2: 100% 99% 100% 100%  Weight:      Height:        Final diagnoses:  Closed displaced intertrochanteric fracture of right femur,  initial encounter (HCC)    Admission/ observation were discussed with the admitting physician, patient and/or family and they are comfortable with the plan.          Final Clinical Impression(s) / ED Diagnoses Final diagnoses:  Closed displaced intertrochanteric fracture of right femur, initial encounter [facility])    Rx / DC Orders ED Discharge Orders     None         [facility], DO 02/07/22 02/09/22

## 2022-02-07 NOTE — Assessment & Plan Note (Signed)
-  Patient with chronic ETOH dependence -BAL on admission: 258 -He is at high risk for complications of withdrawal including seizures, DTs -CIWA protocol -Folate, thiamine, and MVI ordered -Will provide symptom-triggered BZD (ativan per CIWA protocol)  -TOC team consult for possible inpatient treatment -Will also check UDS. -Consider offering a medication for Alcohol Use Disorder at the time of d/c, to include Disulfuram; Naltrexone; or Acamprosate.

## 2022-02-07 NOTE — Anesthesia Procedure Notes (Addendum)
Spinal  Patient location during procedure: OR Start time: 02/07/2022 2:02 PM End time: 02/07/2022 2:10 PM Reason for block: surgical anesthesia Staffing Performed: anesthesiologist  Anesthesiologist: Lannie Fields, DO Preanesthetic Checklist Completed: patient identified, IV checked, risks and benefits discussed, surgical consent, monitors and equipment checked, pre-op evaluation and timeout performed Spinal Block Patient position: right lateral decubitus Prep: DuraPrep and site prepped and draped Patient monitoring: cardiac monitor, continuous pulse ox and blood pressure Approach: midline Location: L3-4 Injection technique: single-shot Needle Needle type: Pencan  Needle gauge: 24 G Needle length: 9 cm Assessment Sensory level: T6 Events: CSF return Additional Notes Functioning IV was confirmed and monitors were applied. Sterile prep and drape, including hand hygiene and sterile gloves were used. The patient was positioned and the spine was prepped. The skin was anesthetized with lidocaine.  Free flow of clear CSF was obtained prior to injecting local anesthetic into the CSF.  The spinal needle aspirated freely following injection.  The needle was carefully withdrawn.  The patient tolerated the procedure well.

## 2022-02-07 NOTE — Anesthesia Postprocedure Evaluation (Signed)
Anesthesia Post Note  Patient: Derrick Andrade  Procedure(s) Performed: INTRAMEDULLARY (IM) NAIL INTERTROCHANTRIC (Right: Leg Upper)     Patient location during evaluation: PACU Anesthesia Type: MAC and Spinal Level of consciousness: awake and alert and oriented Pain management: pain level controlled Vital Signs Assessment: post-procedure vital signs reviewed and stable Respiratory status: spontaneous breathing, nonlabored ventilation and respiratory function stable Cardiovascular status: blood pressure returned to baseline and stable Postop Assessment: no headache, no backache, spinal receding and patient able to bend at knees Anesthetic complications: no   No notable events documented.  Last Vitals:  Vitals:   02/07/22 1555 02/07/22 1625  BP: (!) 176/67 (!) 178/71  Pulse: (!) 48 (!) 48  Resp: 12   Temp: 36.4 C 36.4 C  SpO2: 98%     Last Pain:  Vitals:   02/07/22 1625  TempSrc: Oral  PainSc:                  [name]

## 2022-02-07 NOTE — Assessment & Plan Note (Signed)
-  Continue Norvasc, Hyzaar

## 2022-02-07 NOTE — H&P (Signed)
History and Physical    Patient: Derrick Andrade OZD:[id number] DOB: September 23, 1961 DOA: 02/07/2022 DOS: the patient was seen and examined on 02/07/2022 PCP: Storm Frisk, MD  Patient coming from: Home - lives with niece and her husband; NOK: Niece, Jadie Comas, [phone]   Chief Complaint: R hip fracture  HPI: Derrick Andrade is a 61 y.o. male with medical history significant of ETOH dependence and HTN.  He was intoxicated this AM at the tie of my evaluation and unable to provide significant history.  He was able to report R hip pain.  His niece reports that he lives with her and drinks a lot of beer and smokes cigarettes; she is unsure about drugs.  She does think that he has a problem with alcohol and would benefit form stopping.    ER Course:  Carryover, per Dr. Rachael Darby:  61 yo male with hx of HTN, alcohol abuse who fell and fractured right hip.  CIWA ordered. Ortho consulted. Pt NPO    Review of Systems: unable to review all systems due to the inability of the patient to answer questions. Past Medical History:  Diagnosis Date   Alcohol dependence (HCC)    Hypertension    Perirectal abscess 09/06/2019   Tobacco dependence    Wrist fracture    left   Past Surgical History:  Procedure Laterality Date   INCISE AND DRAIN ABCESS     "boil" on buttock   INCISION AND DRAINAGE PERIRECTAL ABSCESS N/A 09/06/2019   Procedure: IRRIGATION AND DEBRIDEMENT PERIRECTAL ABSCESS;  Surgeon: Berna Bue, MD;  Location: [facility] OR;  Service: General;  Laterality: N/A;   PARS PLANA VITRECTOMY Right 03/14/2015   Procedure: Exploration Right Ruptured Globe, Vitrous Tap, Antibiotic Injection, PARS PLANA VITRECTOMY 25 GAUGE WITH LASER;  Surgeon: Carmela Rima, MD;  Location: [facility] OR;  Service: Ophthalmology;  Laterality: Right;   Social History:  reports that he has been smoking. He has been smoking an average of .5 packs per day. He has never used smokeless tobacco. He reports current alcohol use  of about 1.0 - 2.0 standard drink per week. He reports that he does not use drugs.  No Known Allergies  Family History  Family history unknown: Yes    Prior to Admission medications   Medication Sig Start Date End Date Taking? Authorizing Provider  acetaminophen (TYLENOL) 325 MG tablet Take 2 tablets (650 mg total) by mouth every 6 (six) hours as needed for mild pain (or temp > 100). 09/09/19   Juliet Rude, PA-C  amLODipine (NORVASC) 10 MG tablet TAKE 1 TABLET (10 MG TOTAL) BY MOUTH DAILY. 02/04/21 02/04/22  Storm Frisk, MD  buPROPion (WELLBUTRIN SR) 150 MG 12 hr tablet TAKE 1 TABLET (150 MG TOTAL) BY MOUTH DAILY. 02/04/21 02/04/22  Storm Frisk, MD  losartan-hydrochlorothiazide (HYZAAR) 100-12.5 MG tablet TAKE 1 TABLET BY MOUTH DAILY. 02/04/21 02/04/22  Storm Frisk, MD    Physical Exam: Vitals:   02/07/22 1540 02/07/22 1555 02/07/22 1625 02/07/22 1730  BP: (!) 173/72 (!) 176/67 (!) 178/71 (!) 178/68  Pulse: (!) 50 (!) 48 (!) 48 (!) 53  Resp: 11 12 14 16   Temp:  97.6 F (36.4 C) 97.6 F (36.4 C) 97.6 F (36.4 C)  TempSrc:   Oral Oral  SpO2: 100% 98% 98% 99%  Weight:      Height:       General:  Appears disheveled and clearly with R hip pain Eyes:  L eye visual  impairment ENT:  grossly normal hearing, lips & tongue, mmm; poor dentition Neck:  no LAD, masses or thyromegaly Cardiovascular:  RRR, no m/r/g. No LE edema.  Respiratory:   CTA bilaterally with no wheezes/rales/rhonchi.  Normal respiratory effort. Abdomen:  soft, NT, ND Skin:  no rash or induration seen on limited exam Musculoskeletal:  despite R hip pain he was still moving the leg and writhing in the bed Psychiatric:  somnolent mood and affect, speech sparse Neurologic:  unable to effectively perform   Radiological Exams on Admission: Independently reviewed - see discussion in A/P where applicable  DG Chest Port 1 View  Result Date: 02/07/2022 CLINICAL DATA:  Preop EXAM: PORTABLE CHEST 1 VIEW  COMPARISON:  09/06/2019 FINDINGS: Normal heart size and mediastinal contours. Mild streaky density at the bases. No acute infiltrate or edema. No effusion or pneumothorax. No acute osseous findings. IMPRESSION: Mild streaky density at the bases usually from atelectasis. Electronically Signed   By: Tiburcio Pea M.D.   On: 02/07/2022 06:39   DG Knee Right Port  Result Date: 02/07/2022 CLINICAL DATA:  Fall right knee pain EXAM: PORTABLE RIGHT KNEE - 1-2 VIEW COMPARISON:  None. FINDINGS: There is no evidence of acute fracture or dislocation. Deformity of the proximal fibula is suggestive of prior fracture. Alignment is normal. The joint spaces are preserved. The soft tissues are unremarkable. There is no effusion. IMPRESSION: 1. No acute fracture or dislocation. 2.  Remote fracture of the proximal right fibula. Electronically Signed   By: Lesia Hausen M.D.   On: 02/07/2022 09:54   DG C-Arm 1-60 Min-No Report  Result Date: 02/07/2022 Fluoroscopy was utilized by the requesting physician.  No radiographic interpretation.   DG HIP PORT UNILAT W OR W/O PELVIS 1V RIGHT  Result Date: 02/07/2022 CLINICAL DATA:  Postop EXAM: DG HIP (WITH OR WITHOUT PELVIS) 1V PORT RIGHT COMPARISON:  02/07/2022 FINDINGS: Pubic symphysis is intact. Interval intramedullary rod and distal screw fixation of the right femur of intertrochanteric fracture. Anatomic alignment. Gas in the soft tissues consistent with recent surgery IMPRESSION: Interval surgical fixation of right intertrochanteric fracture with expected postsurgical changes. Electronically Signed   By: Jasmine Pang M.D.   On: 02/07/2022 16:30   DG Hip Unilat W or Wo Pelvis 2-3 Views Right  Result Date: 02/07/2022 CLINICAL DATA:  Right hip pain EXAM: DG HIP (WITH OR WITHOUT PELVIS) 2-3V RIGHT COMPARISON:  None. FINDINGS: A intratrochanteric fracture of the right hip is present, likely acute in nature with a lucency seen within the greater trochanter and acute avulsion of  the lesser trochanter noted. There is varus angulation of the right femoral neck, new since prior CT examination of 09/06/2019. No dislocation. Hip joint spaces are preserved. Limited evaluation of the left hip is unremarkable. Soft tissues are unremarkable. IMPRESSION: Suspected acute intratrochanteric fracture of the right hip with avulsion of the lesser trochanter and resultant varus angulation. This could be confirmed with CT imaging. Electronically Signed   By: Helyn Numbers M.D.   On: 02/07/2022 04:11   DG FEMUR, MIN 2 VIEWS RIGHT  Result Date: 02/07/2022 CLINICAL DATA:  Intramedullary nail surgery EXAM: RIGHT FEMUR 2 VIEWS COMPARISON:  Right hip radiographs 02/07/2022 FINDINGS: Images were performed intraoperatively without the presence of a radiologist. Patient appears to be undergoing cephalomedullary nail fixation of the previously seen proximal right femoral intertrochanteric fracture. Please see intraoperative findings for further detail. IMPRESSION: Proximal right femoral ORIF. Electronically Signed   By: Neita Garnet M.D.   On:  02/07/2022 15:09    EKG: Independently reviewed.  NSR with rate 64; nonspecific ST changes with NSCSLT   Labs on Admission: I have personally reviewed the available labs and imaging studies at the time of the admission.  Pertinent labs:    K+ 3.1 Hgb 10.1 COVID/flu negative ETOH 258    Assessment and Plan: * Closed right hip fracture (HCC)- (present on admission) -Apparently mechanical fall resulting in hip fracture -Orthopedics consulted -NPO in anticipation of surgical repair -SCDs overnight, start Lovenox post-operatively (or as per ortho) -Pain control with Tylenol, Robaxin, Oxycodone, and Morphine prn -TOC team consult for rehab placement -Will need PT consult post-operatively -Hip fracture order set utilized -TXA per orthopedics -Fascia iliacus block deferred since patient was not capable of signing consent  Alcohol dependence (HCC)-  (present on admission) -Patient with chronic ETOH dependence -BAL on admission: 258 -He is at high risk for complications of withdrawal including seizures, DTs -CIWA protocol -Folate, thiamine, and MVI ordered -Will provide symptom-triggered BZD (ativan per CIWA protocol)  -TOC team consult for possible inpatient treatment -Will also check UDS. -Consider offering a medication for Alcohol Use Disorder at the time of d/c, to include Disulfuram; Naltrexone; or Acamprosate.  Tobacco use- (present on admission) -Encourage cessation.   -Patch ordered  Primary hypertension- (present on admission) -Continue Norvasc, Hyzaar       Advance Care Planning:   Code Status: Full Code   Consults: Orthopedics; TOC team; will need PT post-operatively  DVT Prophylaxis: SCDs -> Lovenox when ok with orthopedics  Family Communication: I spoke with his niece this evening, post-operatively; she had already spoken with Dr. Jena Gauss.  Severity of Illness: The appropriate patient status for this patient is INPATIENT. Inpatient status is judged to be reasonable and necessary in order to provide the required intensity of service to ensure the patient's safety. The patient's presenting symptoms, physical exam findings, and initial radiographic and laboratory data in the context of their chronic comorbidities is felt to place them at high risk for further clinical deterioration. Furthermore, it is not anticipated that the patient will be medically stable for discharge from the hospital within 2 midnights of admission.   * I certify that at the point of admission it is my clinical judgment that the patient will require inpatient hospital care spanning beyond 2 midnights from the point of admission due to high intensity of service, high risk for further deterioration and high frequency of surveillance required.*  Author: Jonah Blue, MD 02/07/2022 6:48 PM  For on call review [email or web address].

## 2022-02-07 NOTE — Anesthesia Preprocedure Evaluation (Addendum)
Anesthesia Evaluation  Patient identified by MRN, date of birth, ID band Patient awake    Reviewed: Allergy & Precautions, NPO status , Patient's Chart, lab work & pertinent test results  Airway Mallampati: III  TM Distance: >3 FB Neck ROM: Full    Dental  (+) Edentulous Upper, Edentulous Lower   Pulmonary Current Smoker and Patient abstained from smoking.,    Pulmonary exam normal breath sounds clear to auscultation       Cardiovascular hypertension, Pt. on medications Normal cardiovascular exam Rhythm:Regular Rate:Normal     Neuro/Psych negative neurological ROS  negative psych ROS   GI/Hepatic negative GI ROS, (+)       alcohol use,   Endo/Other  negative endocrine ROS  Renal/GU negative Renal ROS  negative genitourinary   Musculoskeletal Right hip fx   Abdominal   Peds  Hematology hct 31.7, plt 318   Anesthesia Other Findings   Reproductive/Obstetrics negative OB ROS                            Anesthesia Physical Anesthesia Plan  ASA: 2  Anesthesia Plan: MAC and Spinal   Post-op Pain Management: Tylenol PO (pre-op)*   Induction:   PONV Risk Score and Plan: 2 and Propofol infusion and TIVA  Airway Management Planned: Natural Airway and Simple Face Mask  Additional Equipment: None  Intra-op Plan:   Post-operative Plan:   Informed Consent: I have reviewed the patients History and Physical, chart, labs and discussed the procedure including the risks, benefits and alternatives for the proposed anesthesia with the patient or authorized representative who has indicated his/her understanding and acceptance.     Dental advisory given  Plan Discussed with: CRNA  Anesthesia Plan Comments:        Anesthesia Quick Evaluation

## 2022-02-07 NOTE — ED Triage Notes (Signed)
BIB [facility] after falling while intoxicated. C/O R hip, R knee pain. No blood thinners, denies hitting head, no LOC.  Possible crack cocaine use per EMS but pt denies.

## 2022-02-07 NOTE — Transfer of Care (Signed)
Immediate Anesthesia Transfer of Care Note  Patient: Derrick Andrade  Procedure(s) Performed: INTRAMEDULLARY (IM) NAIL INTERTROCHANTRIC (Right: Leg Upper)  Patient Location: PACU  Anesthesia Type:MAC combined with regional for post-op pain  Level of Consciousness: drowsy and patient cooperative  Airway & Oxygen Therapy: Patient Spontanous Breathing  Post-op Assessment: Report given to RN and Post -op Vital signs reviewed and stable  Post vital signs: Reviewed and stable  Last Vitals:  Vitals Value Taken Time  BP 155/67 02/07/22 1524  Temp    Pulse 52 02/07/22 1525  Resp 21 02/07/22 1525  SpO2 100 % 02/07/22 1525  Vitals shown include unvalidated device data.  Last Pain:  Vitals:   02/07/22 1317  TempSrc: Oral  PainSc:          Complications: No notable events documented.

## 2022-02-07 NOTE — Assessment & Plan Note (Signed)
-  Apparently mechanical fall resulting in hip fracture -Orthopedics consulted -NPO in anticipation of surgical repair -SCDs overnight, start Lovenox post-operatively (or as per ortho) -Pain control with Tylenol, Robaxin, Oxycodone, and Morphine prn -TOC team consult for rehab placement -Will need PT consult post-operatively -Hip fracture order set utilized -TXA per orthopedics -Fascia iliacus block deferred since patient was not capable of signing consent

## 2022-02-07 NOTE — H&P (View-Only) (Signed)
Reason for Consult:Right hip fx Referring Physician: Jonah Blue Time called: 0930 Time at bedside: 0947   Derrick Andrade is an 61 y.o. male.  HPI: Derrick Andrade was walking back from the bathroom when he lost his balance and fell. He had immediate pain in his right hip and could not get up. He was brought to the ED where x-rays showed a right hip fx and orthopedic surgery was consulted. He lives at home with his sister and her kids and works in Aeronautical engineer.  Past Medical History:  Diagnosis Date   Perirectal abscess 09/06/2019   Wrist fracture    left    Past Surgical History:  Procedure Laterality Date   INCISE AND DRAIN ABCESS     "boil" on buttock   INCISION AND DRAINAGE PERIRECTAL ABSCESS N/A 09/06/2019   Procedure: IRRIGATION AND DEBRIDEMENT PERIRECTAL ABSCESS;  Surgeon: Berna Bue, MD;  Location: [facility] OR;  Service: General;  Laterality: N/A;   PARS PLANA VITRECTOMY Right 03/14/2015   Procedure: Exploration Right Ruptured Globe, Vitrous Tap, Antibiotic Injection, PARS PLANA VITRECTOMY 25 GAUGE WITH LASER;  Surgeon: Carmela Rima, MD;  Location: [facility] OR;  Service: Ophthalmology;  Laterality: Right;    Family History  Family history unknown: Yes    Social History:  reports that he has been smoking. He has been smoking an average of .5 packs per day. He has never used smokeless tobacco. He reports current alcohol use of about 1.0 - 2.0 standard drink per week. He reports that he does not use drugs.  Allergies: No Known Allergies  Medications: I have reviewed the patient's current medications.  Results for orders placed or performed during the hospital encounter of 02/07/22 (from the past 48 hour(s))  CBC with Differential     Status: Abnormal   Collection Time: 02/07/22  4:30 AM  Result Value Ref Range   WBC 8.6 4.0 - 10.5 K/uL   RBC 3.92 (L) 4.22 - 5.81 MIL/uL   Hemoglobin 10.1 (L) 13.0 - 17.0 g/dL   HCT 41.6 (L) 60.6 - 30.1 %   MCV 80.9 80.0 - 100.0 fL   MCH 25.8 (L)  26.0 - 34.0 pg   MCHC 31.9 30.0 - 36.0 g/dL   RDW 60.1 09.3 - 23.5 %   Platelets 318 150 - 400 K/uL   nRBC 0.0 0.0 - 0.2 %   Neutrophils Relative % 78 %   Neutro Abs 6.7 1.7 - 7.7 K/uL   Lymphocytes Relative 16 %   Lymphs Abs 1.4 0.7 - 4.0 K/uL   Monocytes Relative 5 %   Monocytes Absolute 0.4 0.1 - 1.0 K/uL   Eosinophils Relative 0 %   Eosinophils Absolute 0.0 0.0 - 0.5 K/uL   Basophils Relative 0 %   Basophils Absolute 0.0 0.0 - 0.1 K/uL   Immature Granulocytes 1 %   Abs Immature Granulocytes 0.08 (H) 0.00 - 0.07 K/uL    Comment: Performed at [facility], [address], [place], [place] [address]  Basic metabolic panel     Status: Abnormal   Collection Time: 02/07/22  4:30 AM  Result Value Ref Range   Sodium 140 135 - 145 mmol/L   Potassium 3.1 (L) 3.5 - 5.1 mmol/L   Chloride 106 98 - 111 mmol/L   CO2 22 22 - 32 mmol/L   Glucose, Bld 109 (H) 70 - 99 mg/dL    Comment: Glucose reference range applies only to samples taken after fasting for at least 8 hours.  BUN 6 6 - 20 mg/dL  ° Creatinine, Ser 0.86 0.61 - 1.24 mg/dL  ° Calcium 8.4 (L) 8.9 - 10.3 mg/dL  ° GFR, Estimated >60 >60 mL/min  °  Comment: (NOTE) °Calculated using the CKD-EPI Creatinine Equation (2021) °  ° Anion gap 12 5 - 15  °  Comment: Performed at [facility], [address] [place], [place] [address]  °ABO/Rh     Status: None  ° Collection Time: 02/07/22  4:30 AM  °Result Value Ref Range  ° ABO/RH(D)    °  B POS °Performed at [facility], [address] [place], [place] [address] °  °Ethanol     Status: Abnormal  ° Collection Time: 02/07/22  4:31 AM  °Result Value Ref Range  ° Alcohol, Ethyl (B) 258 (H) <10 mg/dL  °  Comment: (NOTE) °Lowest detectable limit for serum alcohol is 10 mg/dL. ° °For medical purposes only. °Performed at [facility], [address] [place], [place] °[address] °  °Resp Panel by RT-PCR (Flu A&B, Covid) Nasopharyngeal Swab     Status: None  ° Collection Time: 02/07/22   4:32 AM  ° Specimen: Nasopharyngeal Swab; Nasopharyngeal(NP) swabs in vial transport medium  °Result Value Ref Range  ° SARS Coronavirus 2 by RT PCR NEGATIVE NEGATIVE  °  Comment: (NOTE) °SARS-CoV-2 target nucleic acids are NOT DETECTED. ° °The SARS-CoV-2 RNA is generally detectable in upper respiratory °specimens during the acute phase of infection. The lowest °concentration of SARS-CoV-2 viral copies this assay can detect is °138 copies/mL. A negative result does not preclude SARS-Cov-2 °infection and should not be used as the sole basis for treatment or °other patient management decisions. A negative result may occur with  °improper specimen collection/handling, submission of specimen other °than nasopharyngeal swab, presence of viral mutation(s) within the °areas targeted by this assay, and inadequate number of viral °copies(<138 copies/mL). A negative result must be combined with °clinical observations, patient history, and epidemiological °information. The expected result is Negative. ° °Fact Sheet for Patients:  °https://www.fda.gov/media/152166/download ° °Fact Sheet for Healthcare Providers:  °https://www.fda.gov/media/152162/download ° °This test is no t yet approved or cleared by the [place] FDA and  °has been authorized for detection and/or diagnosis of SARS-CoV-2 by °FDA under an Emergency Use Authorization (EUA). This EUA will remain  °in effect (meaning this test can be used) for the duration of the °COVID-19 declaration under Section 564(b)(1) of the Act, 21 °U.S.C.section 360bbb-3(b)(1), unless the authorization is terminated  °or revoked sooner.  ° ° °  ° Influenza A by PCR NEGATIVE NEGATIVE  ° Influenza B by PCR NEGATIVE NEGATIVE  °  Comment: (NOTE) °The Xpert Xpress SARS-CoV-2/FLU/RSV plus assay is intended as an aid °in the diagnosis of influenza from Nasopharyngeal swab specimens and °should not be used as a sole basis for treatment. Nasal washings and °aspirates are unacceptable for  Xpert Xpress SARS-CoV-2/FLU/RSV °testing. ° °Fact Sheet for Patients: °https://www.fda.gov/media/152166/download ° °Fact Sheet for Healthcare Providers: °https://www.fda.gov/media/152162/download ° °This test is not yet approved or cleared by the [place] FDA and °has been authorized for detection and/or diagnosis of SARS-CoV-2 by °FDA under an Emergency Use Authorization (EUA). This EUA will remain °in effect (meaning this test can be used) for the duration of the °COVID-19 declaration under Section 564(b)(1) of the Act, 21 U.S.C. °section 360bbb-3(b)(1), unless the authorization is terminated or °revoked. ° °Performed at  Hospital Lab, [address] [place], [place] °[address] °  °  Type and screen     Status: None   Collection Time: 02/07/22  4:41 AM  Result Value Ref Range   ABO/RH(D) B POS    Antibody Screen NEG    Sample Expiration      02/10/2022,2359 Performed at [facility], [address], [place], [place] [address]     DG Chest Port 1 View  Result Date: 02/07/2022 CLINICAL DATA:  Preop EXAM: PORTABLE CHEST 1 VIEW COMPARISON:  09/06/2019 FINDINGS: Normal heart size and mediastinal contours. Mild streaky density at the bases. No acute infiltrate or edema. No effusion or pneumothorax. No acute osseous findings. IMPRESSION: Mild streaky density at the bases usually from atelectasis. Electronically Signed   By: Tiburcio Pea M.D.   On: 02/07/2022 06:39   DG Hip Unilat W or Wo Pelvis 2-3 Views Right  Result Date: 02/07/2022 CLINICAL DATA:  Right hip pain EXAM: DG HIP (WITH OR WITHOUT PELVIS) 2-3V RIGHT COMPARISON:  None. FINDINGS: A intratrochanteric fracture of the right hip is present, likely acute in nature with a lucency seen within the greater trochanter and acute avulsion of the lesser trochanter noted. There is varus angulation of the right femoral neck, new since prior CT examination of 09/06/2019. No dislocation. Hip joint spaces are preserved. Limited evaluation of  the left hip is unremarkable. Soft tissues are unremarkable. IMPRESSION: Suspected acute intratrochanteric fracture of the right hip with avulsion of the lesser trochanter and resultant varus angulation. This could be confirmed with CT imaging. Electronically Signed   By: Helyn Numbers M.D.   On: 02/07/2022 04:11    Review of Systems  HENT:  Negative for ear discharge, ear pain, hearing loss and tinnitus.   Eyes:  Negative for photophobia and pain.  Respiratory:  Negative for cough and shortness of breath.   Cardiovascular:  Negative for chest pain.  Gastrointestinal:  Negative for abdominal pain, nausea and vomiting.  Genitourinary:  Negative for dysuria, flank pain, frequency and urgency.  Musculoskeletal:  Positive for arthralgias (Right hip). Negative for back pain, myalgias and neck pain.  Neurological:  Negative for dizziness and headaches.  Hematological:  Does not bruise/bleed easily.  Psychiatric/Behavioral:  The patient is not nervous/anxious.   Blood pressure (!) 160/95, pulse 81, temperature 97.8 F (36.6 C), temperature source Oral, resp. rate 19, height 6' (1.829 m), weight 73.8 kg, SpO2 98 %. Physical Exam Constitutional:      General: He is not in acute distress.    Appearance: He is well-developed. He is not diaphoretic.  HENT:     Head: Normocephalic and atraumatic.  Eyes:     General: No scleral icterus.       Right eye: No discharge.        Left eye: No discharge.     Conjunctiva/sclera: Conjunctivae normal.  Cardiovascular:     Rate and Rhythm: Normal rate and regular rhythm.  Pulmonary:     Effort: Pulmonary effort is normal. No respiratory distress.  Musculoskeletal:     Cervical back: Normal range of motion.     Comments: RLE No traumatic wounds, ecchymosis, or rash  Mod-severe TTP hip  No knee or ankle effusion  Knee stable to varus/ valgus and anterior/posterior stress  Sens DPN, SPN, TN intact  Motor EHL, ext, flex, evers 5/5  DP 2+, PT 2+, No  significant edema  Skin:    General: Skin is warm and dry.  Neurological:     Mental Status: He is alert.  Psychiatric:  Mood and Affect: Mood normal.     °   Behavior: Behavior normal.  ° ° °Assessment/Plan: °Right hip fx -- Plan IMN today with Dr. Haddix. Please keep NPO. ° ° ° °Jessilyn Catino J. Quincy Boy, PA-C °Orthopedic Surgery °[phone] °02/07/2022, 9:51 AM  °

## 2022-02-07 NOTE — Anesthesia Procedure Notes (Signed)
Procedure Name: MAC Date/Time: 02/07/2022 2:11 PM Performed by: Colin Benton, CRNA Pre-anesthesia Checklist: Patient identified, Emergency Drugs available, Suction available and Patient being monitored Patient Re-evaluated:Patient Re-evaluated prior to induction Oxygen Delivery Method: Nasal cannula Induction Type: IV induction Placement Confirmation: positive ETCO2 Dental Injury: Teeth and Oropharynx as per pre-operative assessment

## 2022-02-07 NOTE — Op Note (Signed)
Orthopaedic Surgery Operative Note (CSN: [id number] ) Date of Surgery: 02/07/2022  Admit Date: 02/07/2022   Diagnoses: Pre-Op Diagnoses: Right intertrochanteric femur fracture  Post-Op Diagnosis: Same  Procedures: CPT 27245-Cephalomedullary nailing of right intertrochanteric femur fracture  Surgeons : Primary: Shona Needles, MD  Assistant: Patrecia Pace, PA-C  Location: OR 3   Anesthesia:Spinal   Antibiotics: Ancef 2g preop   Tourniquet time:None   Estimated Blood LDJT:701 mL  Complications:None   Specimens:None   Implants: Implant Name Type Inv. Item Serial No. Manufacturer Lot No. LRB No. Used Action  NAIL INTERTAN 10X18 130D 10S - [id number] Nail NAIL INTERTAN 10X18 130D 10S  [employer] [id number] Right 1 Implanted  SCREW LAG COMPR KIT 105/100 - [id number] Screw SCREW LAG COMPR KIT 105/100  [facility] [employer] [id number] Right 1 Implanted  SCREW TRIGEN LOW PROF 5.0X37.5 - [id number] Screw SCREW TRIGEN LOW PROF 5.0X37.5  [employer] [id number] Right 1 Implanted     Indications for Surgery: 61 year old male who sustained a right intertrochanteric femur fracture.  Due to the unstable nature of his injury I recommend proceeding with cephalomedullary nailing of his right hip.  Risks and benefits were discussed with the patient.  Risks included but not limited to bleeding, infection, malunion, nonunion, hardware failure, hardware irritation, nerve or blood vessel injury, DVT, even the possibility anesthetic complications.  He agreed to proceed with surgery and consent was obtained.  Operative Findings: Cephalomedullary nailing of right intertrochanteric femur fracture using [employer] InterTAN 10 x 180 mm nail with a 105 mm lag screw and a 100 mm compression screw  Procedure: The patient was identified in the preoperative holding area. Consent was confirmed with the patient and their family and all questions were answered. The  operative extremity was marked after confirmation with the patient. he was then brought back to the operating room by our anesthesia colleagues.  He was placed under spinal anesthetic.  He was carefully positioned on the Hana table.  All bony prominences were well-padded.  Traction was applied to the right lower extremity and fluoroscopic imaging showed an adequate reduction of his hip fracture.  The right lower extremity was then prepped and draped in usual sterile fashion.  A timeout was performed to verify the patient, the procedure, and the extremity.  Preoperative antibiotics were dosed.  Small incision proximal to the greater trochanter was made.  A threaded guidewire was directed at the tip of the greater trochanter and advanced into the proximal metaphysis.  An entry reamer was used to enter the medullary canal.  I then passed a 10 x 180 mm InterTAN nail down the center of the canal and seated into the was improved position.  Using the targeting arm on the incision and directed a threaded guidewire into the head/neck segment.  I confirmed tip apex distance using fluoroscopic guidance.  I measured the length and chose to use 105 mm lag screw.  I then drilled the path for the compression screw.  I placed an antirotation bar.  I then drilled the path for the lag screw.  Lag screw was placed and then I compressed approximately 5 mm with the compression screw.  I then statically locked the nail and I used the targeting arm to place a distal interlocking screw.  Final fluoroscopic imaging was obtained.  The incision was copiously irrigated.  A layered closure of 0 Vicryl, 2-0 Vicryl 3-0 Monocryl and Dermabond was used to close the skin.  Sterile dressing  was applied.  The patient was then awoken from anesthesia and taken to the PACU in stable condition.  Post Op Plan/Instructions: Patient will be weightbearing as tolerated to the right lower extremity.  He will receive postoperative Ancef.  He will be  placed on Lovenox for DVT prophylaxis and discharged on a DOAC.  We will have him mobilize with physical and Occupational Therapy.  I was present and performed the entire surgery.  Patrecia Pace, PA-C did assist me throughout the case. An assistant was necessary given the difficulty in approach, maintenance of reduction and ability to instrument the fracture.   Katha Hamming, MD [facility]

## 2022-02-07 NOTE — Interval H&P Note (Signed)
History and Physical Interval Note:  02/07/2022 12:59 PM  Derrick Andrade  has presented today for surgery, with the diagnosis of Right intertrochanteric.  The various methods of treatment have been discussed with the patient and family. After consideration of risks, benefits and other options for treatment, the patient has consented to  Procedure(s): INTRAMEDULLARY (IM) NAIL INTERTROCHANTRIC (Right) as a surgical intervention.  The patient's history has been reviewed, patient examined, no change in status, stable for surgery.  I have reviewed the patient's chart and labs.  Questions were answered to the patient's satisfaction.     Caryn Bee P [name]

## 2022-02-07 NOTE — Consult Note (Signed)
Reason for Consult:Right hip fx Referring Physician: Jonah Blue Time called: 0930 Time at bedside: 0947   Derrick Andrade is an 61 y.o. male.  HPI: Derrick Andrade was walking back from the bathroom when he lost his balance and fell. He had immediate pain in his right hip and could not get up. He was brought to the ED where x-rays showed a right hip fx and orthopedic surgery was consulted. He lives at home with his sister and her kids and works in Aeronautical engineer.  Past Medical History:  Diagnosis Date   Perirectal abscess 09/06/2019   Wrist fracture    left    Past Surgical History:  Procedure Laterality Date   INCISE AND DRAIN ABCESS     "boil" on buttock   INCISION AND DRAINAGE PERIRECTAL ABSCESS N/A 09/06/2019   Procedure: IRRIGATION AND DEBRIDEMENT PERIRECTAL ABSCESS;  Surgeon: Berna Bue, MD;  Location: [facility] OR;  Service: General;  Laterality: N/A;   PARS PLANA VITRECTOMY Right 03/14/2015   Procedure: Exploration Right Ruptured Globe, Vitrous Tap, Antibiotic Injection, PARS PLANA VITRECTOMY 25 GAUGE WITH LASER;  Surgeon: Carmela Rima, MD;  Location: [facility] OR;  Service: Ophthalmology;  Laterality: Right;    Family History  Family history unknown: Yes    Social History:  reports that he has been smoking. He has been smoking an average of .5 packs per day. He has never used smokeless tobacco. He reports current alcohol use of about 1.0 - 2.0 standard drink per week. He reports that he does not use drugs.  Allergies: No Known Allergies  Medications: I have reviewed the patient's current medications.  Results for orders placed or performed during the hospital encounter of 02/07/22 (from the past 48 hour(s))  CBC with Differential     Status: Abnormal   Collection Time: 02/07/22  4:30 AM  Result Value Ref Range   WBC 8.6 4.0 - 10.5 K/uL   RBC 3.92 (L) 4.22 - 5.81 MIL/uL   Hemoglobin 10.1 (L) 13.0 - 17.0 g/dL   HCT 41.6 (L) 60.6 - 30.1 %   MCV 80.9 80.0 - 100.0 fL   MCH 25.8 (L)  26.0 - 34.0 pg   MCHC 31.9 30.0 - 36.0 g/dL   RDW 60.1 09.3 - 23.5 %   Platelets 318 150 - 400 K/uL   nRBC 0.0 0.0 - 0.2 %   Neutrophils Relative % 78 %   Neutro Abs 6.7 1.7 - 7.7 K/uL   Lymphocytes Relative 16 %   Lymphs Abs 1.4 0.7 - 4.0 K/uL   Monocytes Relative 5 %   Monocytes Absolute 0.4 0.1 - 1.0 K/uL   Eosinophils Relative 0 %   Eosinophils Absolute 0.0 0.0 - 0.5 K/uL   Basophils Relative 0 %   Basophils Absolute 0.0 0.0 - 0.1 K/uL   Immature Granulocytes 1 %   Abs Immature Granulocytes 0.08 (H) 0.00 - 0.07 K/uL    Comment: Performed at [facility], [address], [place], [place] [address]  Basic metabolic panel     Status: Abnormal   Collection Time: 02/07/22  4:30 AM  Result Value Ref Range   Sodium 140 135 - 145 mmol/L   Potassium 3.1 (L) 3.5 - 5.1 mmol/L   Chloride 106 98 - 111 mmol/L   CO2 22 22 - 32 mmol/L   Glucose, Bld 109 (H) 70 - 99 mg/dL    Comment: Glucose reference range applies only to samples taken after fasting for at least 8 hours.  BUN 6 6 - 20 mg/dL   Creatinine, Ser 2.22 0.61 - 1.24 mg/dL   Calcium 8.4 (L) 8.9 - 10.3 mg/dL   GFR, Estimated >97 >98 mL/min    Comment: (NOTE) Calculated using the CKD-EPI Creatinine Equation (2021)    Anion gap 12 5 - 15    Comment: Performed at [facility], [address], [place], [place] [address]  ABO/Rh     Status: None   Collection Time: 02/07/22  4:30 AM  Result Value Ref Range   ABO/RH(D)      B POS Performed at [facility], [address] [place], [place] [address]   Ethanol     Status: Abnormal   Collection Time: 02/07/22  4:31 AM  Result Value Ref Range   Alcohol, Ethyl (B) 258 (H) <10 mg/dL    Comment: (NOTE) Lowest detectable limit for serum alcohol is 10 mg/dL.  For medical purposes only. Performed at [facility], [address], [place], [place] [address]   Resp Panel by RT-PCR (Flu A&B, Covid) Nasopharyngeal Swab     Status: None   Collection Time: 02/07/22   4:32 AM   Specimen: Nasopharyngeal Swab; Nasopharyngeal(NP) swabs in vial transport medium  Result Value Ref Range   SARS Coronavirus 2 by RT PCR NEGATIVE NEGATIVE    Comment: (NOTE) SARS-CoV-2 target nucleic acids are NOT DETECTED.  The SARS-CoV-2 RNA is generally detectable in upper respiratory specimens during the acute phase of infection. The lowest concentration of SARS-CoV-2 viral copies this assay can detect is 138 copies/mL. A negative result does not preclude SARS-Cov-2 infection and should not be used as the sole basis for treatment or other patient management decisions. A negative result may occur with  improper specimen collection/handling, submission of specimen other than nasopharyngeal swab, presence of viral mutation(s) within the areas targeted by this assay, and inadequate number of viral copies(<138 copies/mL). A negative result must be combined with clinical observations, patient history, and epidemiological information. The expected result is Negative.  Fact Sheet for Patients:  BloggerCourse.com  Fact Sheet for Healthcare Providers:  SeriousBroker.it  This test is no t yet approved or cleared by the [place] FDA and  has been authorized for detection and/or diagnosis of SARS-CoV-2 by FDA under an Emergency Use Authorization (EUA). This EUA will remain  in effect (meaning this test can be used) for the duration of the COVID-19 declaration under Section 564(b)(1) of the Act, 21 U.S.C.section 360bbb-3(b)(1), unless the authorization is terminated  or revoked sooner.       Influenza A by PCR NEGATIVE NEGATIVE   Influenza B by PCR NEGATIVE NEGATIVE    Comment: (NOTE) The Xpert Xpress SARS-CoV-2/FLU/RSV plus assay is intended as an aid in the diagnosis of influenza from Nasopharyngeal swab specimens and should not be used as a sole basis for treatment. Nasal washings and aspirates are unacceptable for  Xpert Xpress SARS-CoV-2/FLU/RSV testing.  Fact Sheet for Patients: BloggerCourse.com  Fact Sheet for Healthcare Providers: SeriousBroker.it  This test is not yet approved or cleared by the [place] FDA and has been authorized for detection and/or diagnosis of SARS-CoV-2 by FDA under an Emergency Use Authorization (EUA). This EUA will remain in effect (meaning this test can be used) for the duration of the COVID-19 declaration under Section 564(b)(1) of the Act, 21 U.S.C. section 360bbb-3(b)(1), unless the authorization is terminated or revoked.  Performed at [facility], [address], [place], [place] [address]  Type and screen     Status: None   Collection Time: 02/07/22  4:41 AM  Result Value Ref Range   ABO/RH(D) B POS    Antibody Screen NEG    Sample Expiration      02/10/2022,2359 Performed at [facility], [address], [place], [place] [address]     DG Chest Port 1 View  Result Date: 02/07/2022 CLINICAL DATA:  Preop EXAM: PORTABLE CHEST 1 VIEW COMPARISON:  09/06/2019 FINDINGS: Normal heart size and mediastinal contours. Mild streaky density at the bases. No acute infiltrate or edema. No effusion or pneumothorax. No acute osseous findings. IMPRESSION: Mild streaky density at the bases usually from atelectasis. Electronically Signed   By: Tiburcio Pea M.D.   On: 02/07/2022 06:39   DG Hip Unilat W or Wo Pelvis 2-3 Views Right  Result Date: 02/07/2022 CLINICAL DATA:  Right hip pain EXAM: DG HIP (WITH OR WITHOUT PELVIS) 2-3V RIGHT COMPARISON:  None. FINDINGS: A intratrochanteric fracture of the right hip is present, likely acute in nature with a lucency seen within the greater trochanter and acute avulsion of the lesser trochanter noted. There is varus angulation of the right femoral neck, new since prior CT examination of 09/06/2019. No dislocation. Hip joint spaces are preserved. Limited evaluation of  the left hip is unremarkable. Soft tissues are unremarkable. IMPRESSION: Suspected acute intratrochanteric fracture of the right hip with avulsion of the lesser trochanter and resultant varus angulation. This could be confirmed with CT imaging. Electronically Signed   By: Helyn Numbers M.D.   On: 02/07/2022 04:11    Review of Systems  HENT:  Negative for ear discharge, ear pain, hearing loss and tinnitus.   Eyes:  Negative for photophobia and pain.  Respiratory:  Negative for cough and shortness of breath.   Cardiovascular:  Negative for chest pain.  Gastrointestinal:  Negative for abdominal pain, nausea and vomiting.  Genitourinary:  Negative for dysuria, flank pain, frequency and urgency.  Musculoskeletal:  Positive for arthralgias (Right hip). Negative for back pain, myalgias and neck pain.  Neurological:  Negative for dizziness and headaches.  Hematological:  Does not bruise/bleed easily.  Psychiatric/Behavioral:  The patient is not nervous/anxious.   Blood pressure (!) 160/95, pulse 81, temperature 97.8 F (36.6 C), temperature source Oral, resp. rate 19, height 6' (1.829 m), weight 73.8 kg, SpO2 98 %. Physical Exam Constitutional:      General: He is not in acute distress.    Appearance: He is well-developed. He is not diaphoretic.  HENT:     Head: Normocephalic and atraumatic.  Eyes:     General: No scleral icterus.       Right eye: No discharge.        Left eye: No discharge.     Conjunctiva/sclera: Conjunctivae normal.  Cardiovascular:     Rate and Rhythm: Normal rate and regular rhythm.  Pulmonary:     Effort: Pulmonary effort is normal. No respiratory distress.  Musculoskeletal:     Cervical back: Normal range of motion.     Comments: RLE No traumatic wounds, ecchymosis, or rash  Mod-severe TTP hip  No knee or ankle effusion  Knee stable to varus/ valgus and anterior/posterior stress  Sens DPN, SPN, TN intact  Motor EHL, ext, flex, evers 5/5  DP 2+, PT 2+, No  significant edema  Skin:    General: Skin is warm and dry.  Neurological:     Mental Status: He is alert.  Psychiatric:  Mood and Affect: Mood normal.        Behavior: Behavior normal.    Assessment/Plan: Right hip fx -- Plan IMN today with Dr. Jena GaussHaddix. Please keep NPO.    Freeman CaldronMichael J. Nekeya Briski, PA-C Orthopedic Surgery [phone] 02/07/2022, 9:51 AM

## 2022-02-07 NOTE — Assessment & Plan Note (Signed)
-  Encourage cessation.   -Patch ordered  

## 2022-02-08 LAB — BASIC METABOLIC PANEL
Anion gap: 8 (ref 5–15)
BUN: 8 mg/dL (ref 6–20)
CO2: 26 mmol/L (ref 22–32)
Calcium: 8.4 mg/dL — ABNORMAL LOW (ref 8.9–10.3)
Chloride: 102 mmol/L (ref 98–111)
Creatinine, Ser: 0.95 mg/dL (ref 0.61–1.24)
GFR, Estimated: >60 mL/min (ref >60)
Glucose, Bld: 146 mg/dL — ABNORMAL HIGH (ref 70–99)
Potassium: 3.7 mmol/L (ref 3.5–5.1)
Sodium: 136 mmol/L (ref 135–145)

## 2022-02-08 LAB — HIV ANTIBODY (ROUTINE TESTING W REFLEX): HIV Screen 4th Generation wRfx: NONREACTIVE

## 2022-02-08 LAB — CBC
HCT: 28.5 % — ABNORMAL LOW (ref 39.0–52.0)
Hemoglobin: 9.4 g/dL — ABNORMAL LOW (ref 13.0–17.0)
MCH: 26.2 pg (ref 26.0–34.0)
MCHC: 33 g/dL (ref 30.0–36.0)
MCV: 79.4 fL — ABNORMAL LOW (ref 80.0–100.0)
Platelets: 312 10*3/uL (ref 150–400)
RBC: 3.59 MIL/uL — ABNORMAL LOW (ref 4.22–5.81)
RDW: 13.5 % (ref 11.5–15.5)
WBC: 15.8 10*3/uL — ABNORMAL HIGH (ref 4.0–10.5)
nRBC: 0 % (ref 0.0–0.2)

## 2022-02-08 LAB — VITAMIN D 25 HYDROXY (VIT D DEFICIENCY, FRACTURES): Vit D, 25-Hydroxy: 8 ng/mL — ABNORMAL LOW (ref 30–100)

## 2022-02-08 MED ORDER — VITAMIN D (ERGOCALCIFEROL) 1.25 MG (50000 UNIT) PO CAPS
50000.0000 [IU] | ORAL_CAPSULE | ORAL | Status: DC
  Administered 2022-02-08: 50000 [IU] via ORAL
  Filled 2022-02-08 (×2): qty 1

## 2022-02-08 NOTE — Evaluation (Signed)
Occupational Therapy Evaluation Patient Details Name: Derrick Andrade MRN: [id number] DOB: [birth year] Today's Date: 02/08/2022   History of Present Illness Pt is a 62 y/o M presenting to ED on 2/24 after mechanical fall, c/o R hip pain. Found to have R intertrochanteric femur fx. S/p IM nail on 2/24. PMH includes alcohol and tobacco dependence and HTN.   Clinical Impression   Pt reports independence at baseline with ADLs and functional mobility, lives with other relatives who can provide PRN assistance at d/c. Pt currently min-max A for ADLs and mod A for bed mobility. Spent majority of the session getting to EOB, pt greatly limited by R hip pain with movement. Despite max encouragement, pt declining standing/OOB mobility attempts at this time. Educated pt on WB precautions and pt verbalizes understanding. Pt presenting with impairments listed below, currently recommending SNF at d/c pending pt progress.     Recommendations for follow up therapy are one component of a multi-disciplinary discharge planning process, led by the attending physician.  Recommendations may be updated based on patient status, additional functional criteria and insurance authorization.   Follow Up Recommendations  Skilled nursing-short term rehab (<3 hours/day)    Assistance Recommended at Discharge Intermittent Supervision/Assistance  Patient can return home with the following Two people to help with walking and/or transfers;Two people to help with bathing/dressing/bathroom;Assistance with cooking/housework;Assist for transportation;Help with stairs or ramp for entrance    Functional Status Assessment  Patient has had a recent decline in their functional status and demonstrates the ability to make significant improvements in function in a reasonable and predictable amount of time.  Equipment Recommendations  BSC/3in1    Recommendations for Other Services PT consult     Precautions / Restrictions  Precautions Precautions: Fall Restrictions Weight Bearing Restrictions: Yes RLE Weight Bearing: Weight bearing as tolerated      Mobility Bed Mobility Overal bed mobility: Needs Assistance Bed Mobility: Supine to Sit, Sit to Supine     Supine to sit: Mod assist Sit to supine: Mod assist   General bed mobility comments: takes increased time to move EOB, pt reporting increased pain with movement, guarding RLE    Transfers                   General transfer comment: pt declining at this time      Balance Overall balance assessment: Needs assistance Sitting-balance support: Feet supported Sitting balance-Leahy Scale: Fair Sitting balance - Comments: able to reach down for sock without LOB                                   ADL either performed or assessed with clinical judgement   ADL Overall ADL's : Needs assistance/impaired Eating/Feeding: Set up;Sitting   Grooming: Sitting;Set up   Upper Body Bathing: Minimal assistance;Sitting;Bed level   Lower Body Bathing: Maximal assistance;Bed level;Sitting/lateral leans   Upper Body Dressing : Minimal assistance;Bed level Upper Body Dressing Details (indicate cue type and reason): dons gown Lower Body Dressing: Maximal assistance;Sitting/lateral leans;Bed level;Moderate assistance Lower Body Dressing Details (indicate cue type and reason): to pull up sock sitting EOB Toilet Transfer: Maximal assistance;Rolling walker (2 wheels);BSC/3in1;Stand-pivot   Toileting- Clothing Manipulation and Hygiene: Maximal assistance;Sitting/lateral lean;Bed level       Functional mobility during ADLs: Moderate assistance;Maximal assistance       Vision   Vision Assessment?: No apparent visual deficits     Perception  Praxis      Pertinent Vitals/Pain Pain Assessment Pain Assessment: Faces Pain Score: 6  Faces Pain Scale: Hurts even more Pain Location: RLE Pain Descriptors / Indicators: Aching,  Discomfort, Grimacing, Guarding Pain Intervention(s): Limited activity within patient's tolerance, Monitored during session, Patient requesting pain meds-RN notified, Repositioned     Hand Dominance     Extremity/Trunk Assessment Upper Extremity Assessment Upper Extremity Assessment: Overall WFL for tasks assessed   Lower Extremity Assessment Lower Extremity Assessment: Defer to PT evaluation   Cervical / Trunk Assessment Cervical / Trunk Assessment: Kyphotic   Communication Communication Communication: No difficulties   Cognition Arousal/Alertness: Awake/alert Behavior During Therapy: WFL for tasks assessed/performed Overall Cognitive Status: Within Functional Limits for tasks assessed                                       General Comments  VSS on 2L O2    Exercises     Shoulder Instructions      Home Living Family/patient expects to be discharged to:: Private residence Living Arrangements: Other relatives (lives with neice and daughter) Available Help at Discharge: Available PRN/intermittently Type of Home: House Home Access: Ramped entrance     Home Layout: One level     Bathroom Shower/Tub: Walk-in shower;Curtain   Bathroom Toilet: Standard Bathroom Accessibility: Yes   Home Equipment: Pharmacist, hospital (2 wheels)          Prior Functioning/Environment Prior Level of Function : Independent/Modified Independent;Working/employed             Mobility Comments: does not use AD ADLs Comments: does IADLs        OT Problem List: Decreased strength;Decreased range of motion;Decreased activity tolerance;Impaired balance (sitting and/or standing);Decreased safety awareness;Decreased knowledge of use of DME or AE;Pain      OT Treatment/Interventions: Self-care/ADL training;Therapeutic exercise;DME and/or AE instruction;Therapeutic activities;Patient/family education;Balance training    OT Goals(Current goals can be found in the  care plan section) Acute Rehab OT Goals Patient Stated Goal: decrease pain OT Goal Formulation: With patient Time For Goal Achievement: 02/22/22 Potential to Achieve Goals: Fair ADL Goals Pt Will Perform Upper Body Dressing: with supervision;sitting Pt Will Perform Lower Body Dressing: with mod assist;sitting/lateral leans;sit to/from stand Pt Will Transfer to Toilet: with mod assist;squat pivot transfer;stand pivot transfer;bedside commode Pt Will Perform Tub/Shower Transfer: rolling walker;shower seat;ambulating;with mod assist  OT Frequency: Min 2X/week    Co-evaluation              AM-PAC OT "6 Clicks" Daily Activity     Outcome Measure Help from another person eating meals?: None Help from another person taking care of personal grooming?: A Little Help from another person toileting, which includes using toliet, bedpan, or urinal?: A Lot Help from another person bathing (including washing, rinsing, drying)?: A Lot Help from another person to put on and taking off regular upper body clothing?: A Little Help from another person to put on and taking off regular lower body clothing?: Total 6 Click Score: 15   End of Session Nurse Communication: Mobility status;Patient requests pain meds  Activity Tolerance: Patient limited by pain Patient left: in bed;with call bell/phone within reach;with bed alarm set  OT Visit Diagnosis: Unsteadiness on feet (R26.81);Other abnormalities of gait and mobility (R26.89);Muscle weakness (generalized) (M62.81);History of falling (Z91.81);Pain Pain - Right/Left: Right Pain - part of body: Hip;Leg  Time: 2426-8341 OT Time Calculation (min): 44 min Charges:  OT General Charges $OT Visit: 1 Visit OT Evaluation $OT Eval Moderate Complexity: 1 Mod OT Treatments $Self Care/Home Management : 23-37 mins  Alfonzo Beers, OTD, OTR/L Acute Rehab [phone]  Mayer Masker 02/08/2022, 8:57 AM

## 2022-02-08 NOTE — Progress Notes (Signed)
Orthopaedic Trauma Progress Note  SUBJECTIVE: Doing fairly well this morning.  Reports intermittent, mild pain about operative site.  No pain at rest, notes some discomfort up in the hip when moving around in bed.  No chest pain. No SOB. No nausea/vomiting. No other complaints.  Denies any numbness or tingling throughout the right lower extremity.  Has been tolerating diet and fluids well.  Has not been up out of bed since surgery.  OBJECTIVE:  Vitals:   02/07/22 2021 02/07/22 2353  BP: (!) 177/81 (!) 159/79  Pulse: (!) 54 (!) 56  Resp: 16 16  Temp: 98.2 F (36.8 C) 98 F (36.7 C)  SpO2: 100% 100%    General: Sitting up in bed, no acute distress Respiratory: No increased work of breathing.  Right lower extremity: Dressings clean, dry, intact.  No significant tenderness with palpation over the hip or throughout the lateral thigh.  Tolerates gentle knee motion but endorses some discomfort up in the hip with flexion.  No calf tenderness.  Ankle DF/PF intact.  Neurovascularly intact.  IMAGING: Stable post op imaging.   LABS:  Results for orders placed or performed during the hospital encounter of 02/07/22 (from the past 24 hour(s))  Surgical pcr screen     Status: None   Collection Time: 02/07/22  1:26 PM   Specimen: Nasal Mucosa; Nasal Swab  Result Value Ref Range   MRSA, PCR NEGATIVE NEGATIVE   Staphylococcus aureus NEGATIVE NEGATIVE  HIV Antibody (routine testing w rflx)     Status: None   Collection Time: 02/08/22  1:25 AM  Result Value Ref Range   HIV Screen 4th Generation wRfx Non Reactive Non Reactive  VITAMIN D 25 Hydroxy (Vit-D Deficiency, Fractures)     Status: Abnormal   Collection Time: 02/08/22  1:25 AM  Result Value Ref Range   Vit D, 25-Hydroxy 8.00 (L) 30 - 100 ng/mL  Basic metabolic panel     Status: Abnormal   Collection Time: 02/08/22  1:25 AM  Result Value Ref Range   Sodium 136 135 - 145 mmol/L   Potassium 3.7 3.5 - 5.1 mmol/L   Chloride 102 98 - 111 mmol/L    CO2 26 22 - 32 mmol/L   Glucose, Bld 146 (H) 70 - 99 mg/dL   BUN 8 6 - 20 mg/dL   Creatinine, Ser 0.95 0.61 - 1.24 mg/dL   Calcium 8.4 (L) 8.9 - 10.3 mg/dL   GFR, Estimated >60 >60 mL/min   Anion gap 8 5 - 15  CBC     Status: Abnormal   Collection Time: 02/08/22  1:25 AM  Result Value Ref Range   WBC 15.8 (H) 4.0 - 10.5 K/uL   RBC 3.59 (L) 4.22 - 5.81 MIL/uL   Hemoglobin 9.4 (L) 13.0 - 17.0 g/dL   HCT 28.5 (L) 39.0 - 52.0 %   MCV 79.4 (L) 80.0 - 100.0 fL   MCH 26.2 26.0 - 34.0 pg   MCHC 33.0 30.0 - 36.0 g/dL   RDW 13.5 11.5 - 15.5 %   Platelets 312 150 - 400 K/uL   nRBC 0.0 0.0 - 0.2 %    ASSESSMENT: Derrick Andrade is a 61 y.o. male, 1 Day Post-Op s/p INTRAMEDULLARY NAIL RIGHT INTERTROCHANTRIC  CV/Blood loss: Acute blood loss anemia, Hgb 9.4 this morning.   PLAN: Weightbearing: WBAT RLE ROM: Okay for unrestricted hip and knee motion as tolerated Incisional and dressing care: OK to remove dressings 02/09/2022 and leave open to air with  dry gauze PRN  Showering: Okay to begin showering 02/10/2022.  Incisions may get wet at this time Orthopedic device(s): None  Pain management:  1. Tylenol 1000 mg q 6 hours scheduled 2. Robaxin 500 mg q 6 hours PRN 3. Oxycodone 5-10 mg q 4 hours PRN 4. Morphine 1-2 mg q 2 hours PRN VTE prophylaxis: Lovenox, SCDs ID:  Ancef 2gm post op Foley/Lines:  No foley, KVO IVFs Impediments to Fracture Healing: Vitamin D level 8, will start on D2 supplementation Dispo: PT/OT evaluation today, dispo pending.  We will plan to remove dressings from right hip tomorrow.   D/C recommendations: - Percocet 5-325 mg q 6 hours PRN for pain control - Eliquis 2.5 mg twice daily x30 days for DVT prophylaxis - Continue Vit D2 supplementation 50,000 IU q. 7 days x 8 weeks  Follow - up plan: 2 weeks after discharge for repeat x-rays and wound check   Contact information:  Katha Hamming MD, Rushie Nyhan PA-C. After hours and holidays please check [email or web address] for  group call information for [facility]   Gwinda Passe, PA-C [phone] (office) [email or web address]

## 2022-02-08 NOTE — Evaluation (Signed)
Physical Therapy Evaluation Patient Details Name: Derrick Andrade MRN: [id number] DOB: 11-08-1961 Today's Date: 02/08/2022  History of Present Illness  Pt is a 61 y/o M presenting to ED on 2/24 after mechanical fall, c/o R hip pain. Found to have R intertrochanteric femur fx. S/p IM nail on 2/24. PMH includes alcohol and tobacco dependence and HTN.  Clinical Impression  Pt presents to PT with deficits in strength, power, gait, balance, functional mobility, endurance, and with significant R hip pain. Pt reluctant to follow PT cues for bed mobility technique, reporting they would increase his pain. Pt requires a significant increase in time for all mobility. Pt will benefit from continued acute PT services to aide in improving functional mobility quality and progressing to ambulation. PT recommends SNF placement due to impaired mobility and limited caregiver support.     Recommendations for follow up therapy are one component of a multi-disciplinary discharge planning process, led by the attending physician.  Recommendations may be updated based on patient status, additional functional criteria and insurance authorization.  Follow Up Recommendations Skilled nursing-short term rehab (<3 hours/day)    Assistance Recommended at Discharge Intermittent Supervision/Assistance  Patient can return home with the following  A lot of help with walking and/or transfers;A lot of help with bathing/dressing/bathroom;Assistance with cooking/housework;Direct supervision/assist for medications management;Direct supervision/assist for financial management;Assist for transportation;Help with stairs or ramp for entrance    Equipment Recommendations Wheelchair (measurements PT);Wheelchair cushion (measurements PT);BSC/3in1  Recommendations for Other Services       Functional Status Assessment Patient has had a recent decline in their functional status and demonstrates the ability to make significant improvements in  function in a reasonable and predictable amount of time.     Precautions / Restrictions Precautions Precautions: Fall Restrictions Weight Bearing Restrictions: Yes RLE Weight Bearing: Weight bearing as tolerated      Mobility  Bed Mobility Overal bed mobility: Needs Assistance Bed Mobility: Supine to Sit, Sit to Supine     Supine to sit: Min assist Sit to supine: Min assist   General bed mobility comments: assist with RLE. Slow and methodical. Not following PT suggestions to improve mobility technique    Transfers Overall transfer level: Needs assistance Equipment used: Rolling walker (2 wheels) Transfers: Sit to/from Stand Sit to Stand: Mod assist, From elevated surface           General transfer comment: PT cues for hand placement, assist to power up into standing    Ambulation/Gait                  Stairs            Wheelchair Mobility    Modified Rankin (Stroke Patients Only)       Balance Overall balance assessment: Needs assistance Sitting-balance support: No upper extremity supported, Feet supported Sitting balance-Leahy Scale: Fair     Standing balance support: Bilateral upper extremity supported, Reliant on assistive device for balance Standing balance-Leahy Scale: Poor                               Pertinent Vitals/Pain Pain Assessment Pain Assessment: Faces Faces Pain Scale: Hurts even more Pain Location: RLE Pain Descriptors / Indicators: Sore Pain Intervention(s): Monitored during session    Home Living Family/patient expects to be discharged to:: Private residence Living Arrangements: Other relatives (niece) Available Help at Discharge: Available PRN/intermittently Type of Home: House Home Access: Ramped entrance  Home Layout: One level Home Equipment: Pharmacist, hospital (2 wheels)      Prior Function Prior Level of Function : Independent/Modified Independent;Working/employed              Mobility Comments: does not use AD ADLs Comments: does IADLs     Hand Dominance        Extremity/Trunk Assessment   Upper Extremity Assessment Upper Extremity Assessment: Overall WFL for tasks assessed    Lower Extremity Assessment Lower Extremity Assessment: RLE deficits/detail RLE Deficits / Details: pain mediated weakness    Cervical / Trunk Assessment Cervical / Trunk Assessment: Kyphotic  Communication   Communication: No difficulties  Cognition Arousal/Alertness: Awake/alert Behavior During Therapy: WFL for tasks assessed/performed Overall Cognitive Status: Impaired/Different from baseline Area of Impairment: Following commands                       Following Commands: Follows one step commands inconsistently (seems to be pt reluctance to follow PT commands rather than an inability to follow commands)                General Comments General comments (skin integrity, edema, etc.): VSS on RA    Exercises     Assessment/Plan    PT Assessment Patient needs continued PT services  PT Problem List Decreased strength;Decreased activity tolerance;Decreased mobility;Decreased balance;Decreased knowledge of use of DME;Pain       PT Treatment Interventions DME instruction;Gait training;Stair training;Functional mobility training;Therapeutic activities;Therapeutic exercise;Balance training;Neuromuscular re-education;Patient/family education    PT Goals (Current goals can be found in the Care Plan section)  Acute Rehab PT Goals Patient Stated Goal: to reduce pain PT Goal Formulation: With patient Time For Goal Achievement: 02/22/22 Potential to Achieve Goals: Fair    Frequency Min 3X/week     Co-evaluation               AM-PAC PT "6 Clicks" Mobility  Outcome Measure Help needed turning from your back to your side while in a flat bed without using bedrails?: A Little Help needed moving from lying on your back to sitting on the side of  a flat bed without using bedrails?: A Little Help needed moving to and from a bed to a chair (including a wheelchair)?: A Lot Help needed standing up from a chair using your arms (e.g., wheelchair or bedside chair)?: A Lot Help needed to walk in hospital room?: Total Help needed climbing 3-5 steps with a railing? : Total 6 Click Score: 12    End of Session   Activity Tolerance: Patient limited by pain Patient left: in bed;with call bell/phone within reach Nurse Communication: Mobility status PT Visit Diagnosis: Other abnormalities of gait and mobility (R26.89);Muscle weakness (generalized) (M62.81);Pain Pain - Right/Left: Right Pain - part of body: Hip    Time: 1341-1413 PT Time Calculation (min) (ACUTE ONLY): 32 min   Charges:   PT Evaluation $PT Eval Low Complexity: 1 Low PT Treatments $Therapeutic Activity: 8-22 mins        Arlyss Gandy, PT, DPT Acute Rehabilitation Pager: [phone] Office [phone]   Arlyss Gandy 02/08/2022, 2:26 PM

## 2022-02-08 NOTE — Progress Notes (Addendum)
PROGRESS NOTE  Derrick Andrade  DOB: 1961/01/16  PCP: Storm Frisk, MD MWN:[id number]  DOA: 02/07/2022  LOS: 1 day  Hospital Day: 2  Brief narrative: Derrick Andrade is a 61 y.o. male with PMH significant for chronic alcoholism, chronic smoking, hypertension. Patient was brought to the ED on 2/24 by EMS after falling while intoxicated.  He started having right hip and right knee pain. X-ray showed right hip fracture Orthopedics consulted Admitted to hospitalist service Patient underwent cephalomedullary nailing of right hip  Subjective: Patient was seen and examined this morning.  [name].  Sitting up in bed.  Not in distress.  Not in pain.  Principal Problem:   Closed right hip fracture (HCC) Active Problems:   Primary hypertension   Tobacco use   Alcohol dependence (HCC)    Assessment and Plan: Closed right hip fracture   -Secondary to a fall while intoxicated.   -2/24, underwent cephalomedullary nailing. -Pain meds and DVT prophylaxis per orthopedics. -Currently on Robaxin, Tylenol, oxycodone and morphine as needed -PT eval obtained.  SNF recommended.  Chronic alcohol dependence   At risk of withdrawal -Patient lives with his niece.  Reportedly drinks number of beers a day. -Blood alcohol level on admission was elevated to 258 -Currently on CIWA protocol with as needed IV Ativan.  On my evaluation this morning, patient was not in withdrawal.  He does not have tachycardia or altered mental status.  I did not start him on scheduled benzodiazepine for now.  Continue to monitor.  Tobacco use  -Encourage cessation.   -Patch ordered  Primary hypertension  -Continue Norvasc, Hyzaar  Mobility: PT eval obtained Goals of care   Code Status: Full Code    Nutritional status:  Body mass index is 22.07 kg/m.      Diet:  Diet Order             Diet Heart Room service appropriate? Yes; Fluid consistency: Thin  Diet effective now                    DVT prophylaxis:  enoxaparin (LOVENOX) injection 40 mg Start: 02/08/22 0800 SCDs Start: 02/07/22 1630 SCDs Start: 02/07/22 0858   Antimicrobials: None Fluid: Can stop fluid today. Consultants: Orthopedics Family Communication: None at bedside  Status is: Inpatient  Continue in-hospital care because: POD1 Level of care: Med-Surg   Dispo: The patient is from: Home              Anticipated d/c is to: SNF              Patient currently is not medically stable to d/c.   Difficult to place patient No     Infusions:   lactated ringers 75 mL/hr at 02/08/22 4034   methocarbamol (ROBAXIN) IV      Scheduled Meds:  acetaminophen  1,000 mg Oral Q6H   amLODipine  10 mg Oral Daily   buPROPion  150 mg Oral Daily   docusate sodium  100 mg Oral BID   enoxaparin (LOVENOX) injection  40 mg Subcutaneous Q24H   folic acid  1 mg Oral Daily   hydrochlorothiazide  12.5 mg Oral Daily   LORazepam  0-4 mg Intravenous Q6H   Followed by   Melene Muller ON 02/09/2022] LORazepam  0-4 mg Intravenous Q12H   losartan  100 mg Oral Daily   multivitamin with minerals  1 tablet Oral Daily   nicotine  14 mg Transdermal Daily   thiamine  100 mg Oral Daily   Or   thiamine  100 mg Intravenous Daily   Vitamin D (Ergocalciferol)  50,000 Units Oral Q7 days    PRN meds: bisacodyl, diphenhydrAMINE, LORazepam **OR** LORazepam, methocarbamol **OR** methocarbamol (ROBAXIN) IV, metoCLOPramide **OR** metoCLOPramide (REGLAN) injection, morphine injection, ondansetron **OR** ondansetron (ZOFRAN) IV, oxyCODONE, polyethylene glycol   Antimicrobials: Anti-infectives (From admission, onward)    Start     Dose/Rate Route Frequency Ordered Stop   02/07/22 2200  ceFAZolin (ANCEF) IVPB 2g/100 mL premix        2 g 200 mL/hr over 30 Minutes Intravenous Every 8 hours 02/07/22 1629 02/08/22 1349   02/07/22 1300  ceFAZolin (ANCEF) IVPB 2g/100 mL premix        2 g 200 mL/hr over 30 Minutes Intravenous On  call to O.R. 02/07/22 1253 02/07/22 1443       Objective: Vitals:   02/08/22 0904 02/08/22 1310  BP: (!) 168/80 (!) 155/77  Pulse: (!) 57 61  Resp: 17 18  Temp: 98.7 F (37.1 C) 98.7 F (37.1 C)  SpO2: 100% 100%    Intake/Output Summary (Last 24 hours) at 02/08/2022 1558 Last data filed at 02/08/2022 6378 Gross per 24 hour  Intake 1540.51 ml  Output --  Net 1540.51 ml   Filed Weights   02/07/22 0141  Weight: 73.8 kg   Weight change:  Body mass index is 22.07 kg/m.   Physical Exam: General exam: [name].  Not in physical distress Skin: No rashes, lesions or ulcers. HEENT: Atraumatic, normocephalic, no obvious bleeding Lungs: Clear to auscultation bilaterally CVS: Regular rate and rhythm, no murmur GI/Abd soft, nontender, nondistended, bowel sounds CNS: Alert, awake, oriented x3.  Not in alcohol withdrawal Psychiatry: Mood appropriate Extremities: No pedal edema, no calf tenderness  Data Review: I have personally reviewed the laboratory data and studies available.  F/u labs ordered Unresulted Labs (From admission, onward)     Start     Ordered   02/14/22 0500  Creatinine, serum  (enoxaparin (LOVENOX)    CrCl >/= 30 ml/min)  Weekly,   R     Comments: while on enoxaparin therapy    02/07/22 1629   02/08/22 0500  Basic metabolic panel  Daily,   R      02/07/22 1629   02/08/22 0500  CBC  Daily,   R      02/07/22 1629   02/07/22 0858  Urine rapid drug screen (hosp performed)  ONCE - STAT,   STAT        02/07/22 0859            Signed, Lorin Glass, MD [facility] 02/08/2022

## 2022-02-09 LAB — CBC
HCT: 25.7 % — ABNORMAL LOW (ref 39.0–52.0)
Hemoglobin: 8.4 g/dL — ABNORMAL LOW (ref 13.0–17.0)
MCH: 26.2 pg (ref 26.0–34.0)
MCHC: 32.7 g/dL (ref 30.0–36.0)
MCV: 80.1 fL (ref 80.0–100.0)
Platelets: 295 10*3/uL (ref 150–400)
RBC: 3.21 MIL/uL — ABNORMAL LOW (ref 4.22–5.81)
RDW: 13.5 % (ref 11.5–15.5)
WBC: 11.4 10*3/uL — ABNORMAL HIGH (ref 4.0–10.5)
nRBC: 0 % (ref 0.0–0.2)

## 2022-02-09 LAB — BASIC METABOLIC PANEL
Anion gap: 7 (ref 5–15)
BUN: 10 mg/dL (ref 6–20)
CO2: 27 mmol/L (ref 22–32)
Calcium: 8.2 mg/dL — ABNORMAL LOW (ref 8.9–10.3)
Chloride: 99 mmol/L (ref 98–111)
Creatinine, Ser: 1.23 mg/dL (ref 0.61–1.24)
GFR, Estimated: >60 mL/min (ref >60)
Glucose, Bld: 128 mg/dL — ABNORMAL HIGH (ref 70–99)
Potassium: 3.5 mmol/L (ref 3.5–5.1)
Sodium: 133 mmol/L — ABNORMAL LOW (ref 135–145)

## 2022-02-09 MED ORDER — OXYCODONE-ACETAMINOPHEN 5-325 MG PO TABS: 1.0000 | ORAL_TABLET | Freq: Four times a day (QID) | ORAL | 0 refills | Status: DC | PRN

## 2022-02-09 MED ORDER — VITAMIN D (ERGOCALCIFEROL) 1.25 MG (50000 UNIT) PO CAPS: 50000.0000 [IU] | ORAL_CAPSULE | ORAL | 0 refills | Status: DC

## 2022-02-09 MED ORDER — ENSURE ENLIVE PO LIQD
237.0000 mL | Freq: Two times a day (BID) | ORAL | Status: DC
  Administered 2022-02-09 – 2022-02-11 (×2): 237 mL via ORAL

## 2022-02-09 MED ORDER — APIXABAN 2.5 MG PO TABS: 2.5000 mg | ORAL_TABLET | Freq: Two times a day (BID) | ORAL | 0 refills | Status: DC

## 2022-02-09 NOTE — Progress Notes (Signed)
Initial Nutrition Assessment  INTERVENTION:   -Ensure Plus High Protein po BID, each supplement provides 350 kcal and 20 grams of protein.  NUTRITION DIAGNOSIS:   Increased nutrient needs related to post-op healing, hip fracture as evidenced by estimated needs.  GOAL:   Patient will meet greater than or equal to 90% of their needs  MONITOR:   PO intake, Supplement acceptance, Labs, Weight trends, I & O's  REASON FOR ASSESSMENT:   Consult Hip fracture protocol  ASSESSMENT:   61 y.o. male with PMH significant for chronic alcoholism, chronic smoking, hypertension.  Patient was brought to the ED on 2/24 by EMS after falling while intoxicated.  He started having right hip and right knee pain.  X-ray showed right hip fracture  2/24: s/p Cephalomedullary nailing of right intertrochanteric femur fracture  Patient currently consuming 80% of meals. Pt was NPO until later yesterday for surgery on right hip fracture. Pt with history of alcohol dependence. On CIWA.  Will order Ensure supplements given increased healing needs.  Per weight records, no weight changes noted.  Medications:  Colace, Folic acid, Multivitamin with minerals daily, Thiamine, Vitamin D weekly  Labs reviewed: Vitamin D low (8) Low Na  NUTRITION - FOCUSED PHYSICAL EXAM:  Unable to complete, working remotely.  Diet Order:   Diet Order             Diet Heart Room service appropriate? Yes; Fluid consistency: Thin  Diet effective now                   EDUCATION NEEDS:   No education needs have been identified at this time  Skin:  Skin Assessment: Skin Integrity Issues: Skin Integrity Issues:: Incisions Incisions: right hip  Last BM:  2/23  Height:   Ht Readings from Last 1 Encounters:  02/07/22 6' (1.829 m)    Weight:   Wt Readings from Last 1 Encounters:  02/07/22 73.8 kg    BMI:  Body mass index is 22.07 kg/m.  Estimated Nutritional Needs:   Kcal:  2200-2400  Protein:   110-120g  Fluid:  2.2L/day  Clayton Bibles, MS, RD, LDN Inpatient Clinical Dietitian Contact information available via Amion

## 2022-02-09 NOTE — Progress Notes (Signed)
Orthopaedic Trauma Progress Note  SUBJECTIVE: Doing fairly well this morning.  Reports intermittent, mild pain about operative site. No chest pain. No SOB. No nausea/vomiting. No other complaints.  Denies any numbness or tingling throughout the right lower extremity.  Has been tolerating diet and fluids well.  Felt like therapies went fairly well yesterday.  Discussed therapies recommendations for SNF at discharge, patient agreeable to this.  OBJECTIVE:  Vitals:   02/09/22 0521 02/09/22 0730  BP: 139/79 (!) 146/80  Pulse: 70 64  Resp: 14 16  Temp: 98 F (36.7 C) 99.3 F (37.4 C)  SpO2: 99% 99%    General: Sitting up in bed, no acute distress Respiratory: No increased work of breathing.  Right lower extremity: Dressings removed, incisions clean, dry, intact.  No significant tenderness with palpation over the hip or throughout the lateral thigh.  Tolerates knee motion to about 70 degrees.  No calf tenderness.  Ankle DF/PF intact.  Neurovascularly intact.  IMAGING: Stable post op imaging.   LABS:  Results for orders placed or performed during the hospital encounter of 02/07/22 (from the past 24 hour(s))  Basic metabolic panel     Status: Abnormal   Collection Time: 02/09/22  1:07 AM  Result Value Ref Range   Sodium 133 (L) 135 - 145 mmol/L   Potassium 3.5 3.5 - 5.1 mmol/L   Chloride 99 98 - 111 mmol/L   CO2 27 22 - 32 mmol/L   Glucose, Bld 128 (H) 70 - 99 mg/dL   BUN 10 6 - 20 mg/dL   Creatinine, Ser 7.10 0.61 - 1.24 mg/dL   Calcium 8.2 (L) 8.9 - 10.3 mg/dL   GFR, Estimated >62 >69 mL/min   Anion gap 7 5 - 15  CBC     Status: Abnormal   Collection Time: 02/09/22  1:07 AM  Result Value Ref Range   WBC 11.4 (H) 4.0 - 10.5 K/uL   RBC 3.21 (L) 4.22 - 5.81 MIL/uL   Hemoglobin 8.4 (L) 13.0 - 17.0 g/dL   HCT 48.5 (L) 46.2 - 70.3 %   MCV 80.1 80.0 - 100.0 fL   MCH 26.2 26.0 - 34.0 pg   MCHC 32.7 30.0 - 36.0 g/dL   RDW 50.0 93.8 - 18.2 %   Platelets 295 150 - 400 K/uL   nRBC 0.0  0.0 - 0.2 %    ASSESSMENT: Derrick Andrade is a 61 y.o. male, 2 Days Post-Op s/p INTRAMEDULLARY NAIL RIGHT INTERTROCHANTRIC  CV/Blood loss: Acute blood loss anemia, Hgb 8.4 this morning.   PLAN: Weightbearing: WBAT RLE ROM: Okay for unrestricted hip and knee motion as tolerated Incisional and dressing care: Dressings removed today.  Okay to leave open to air. Showering: Okay to begin showering 02/10/2022.  Incisions may get wet at this time Orthopedic device(s): None  Pain management:  1. Tylenol 1000 mg q 6 hours scheduled 2. Robaxin 500 mg q 6 hours PRN 3. Oxycodone 5-10 mg q 4 hours PRN 4. Morphine 1-2 mg q 2 hours PRN VTE prophylaxis: Lovenox, SCDs ID:  Ancef 2gm post op completed Foley/Lines:  No foley, KVO IVFs Impediments to Fracture Healing: Vitamin D level 8, will start on D2 supplementation Dispo: Therapies as tolerated, PT/OT recommending SNF.  TOC following for placement.  Patient stable for discharge from ortho standpoint once cleared by medicine team and therapies.  I have signed and placed discharge Rx in patient's chart  D/C recommendations: - Percocet 5-325 mg q 6 hours PRN for pain  control - Eliquis 2.5 mg twice daily x30 days for DVT prophylaxis - Continue Vit D2 supplementation 50,000 IU q. 7 days x 8 weeks  Follow - up plan: 2 weeks after discharge for repeat x-rays and wound check   Contact information:  Truitt Merle MD, Thyra Breed PA-C. After hours and holidays please check [email or web address] for group call information for [facility]   Thompson Caul, PA-C [phone] (office) [email or web address]

## 2022-02-09 NOTE — Progress Notes (Signed)
PROGRESS NOTE  Derrick Andrade  DOB: 17-Apr-1961  PCP: Storm Frisk, MD OJJ:[id number]  DOA: 02/07/2022  LOS: 2 days  Hospital Day: 3  Brief narrative: Derrick Andrade is a 61 y.o. male with PMH significant for chronic alcoholism, chronic smoking, hypertension. Patient was brought to the ED on 2/24 by EMS after falling while intoxicated.  He started having right hip and right knee pain. X-ray showed right hip fracture Orthopedics consulted Admitted to hospitalist service Patient underwent cephalomedullary nailing of right hip  Subjective: Patient was seen and examined this morning.   Propped up in bed.  Not in distress.  On low-flow oxygen.    Principal Problem:   Closed right hip fracture (HCC) Active Problems:   Primary hypertension   Tobacco use   Alcohol dependence (HCC)    Assessment and Plan: Closed right hip fracture   -Secondary to a fall while intoxicated.   -2/24, underwent cephalomedullary nailing. -Pain meds and DVT prophylaxis per orthopedics. -Currently on Robaxin, Tylenol, oxycodone and morphine as needed -PT eval obtained.  SNF recommended. -At discharge, Per orthopedics, patient will continue Percocet 5-325 mg q 6 hours PRN for pain control -Eliquis 2.5 mg twice daily x30 days for DVT prophylaxis -Patient will follow-up with orthopedics as an outpatient for 2 weeks for repeat x-rays and wound check.  Vitamin D deficiency -Significantly low vitamin D level at 8. -Continue Vit D2 supplementation 50,000 IU q. 7 days x 8 weeks   Chronic alcohol dependence   At risk of withdrawal -Patient lives with his niece.  Reportedly drinks number of beers a day. -Blood alcohol level on admission was elevated to 258 -Currently on CIWA protocol with as needed IV Ativan.    Tobacco use  -Encourage cessation.   -Patch ordered  Primary hypertension  -Continue Norvasc, Hyzaar  Mobility: PT eval obtained Goals of care   Code Status: Full Code    Nutritional  status:  Body mass index is 22.07 kg/m.  Nutrition Problem: Increased nutrient needs Etiology: post-op healing, hip fracture Signs/Symptoms: estimated needs Diet:  Diet Order             Diet Heart Room service appropriate? Yes; Fluid consistency: Thin  Diet effective now                   DVT prophylaxis:  enoxaparin (LOVENOX) injection 40 mg Start: 02/08/22 0800 SCDs Start: 02/07/22 1630 SCDs Start: 02/07/22 0858   Antimicrobials: None Fluid: Not on IV fluid Consultants: Orthopedics Family Communication: None at bedside  Status is: Inpatient  Continue in-hospital care because: Pending SNF Level of care: Med-Surg   Dispo: The patient is from: Home              Anticipated d/c is to: SNF              Patient currently is medically stable to d/c.   Difficult to place patient No     Infusions:   methocarbamol (ROBAXIN) IV      Scheduled Meds:  acetaminophen  1,000 mg Oral Q6H   amLODipine  10 mg Oral Daily   buPROPion  150 mg Oral Daily   docusate sodium  100 mg Oral BID   enoxaparin (LOVENOX) injection  40 mg Subcutaneous Q24H   feeding supplement  237 mL Oral BID BM   folic acid  1 mg Oral Daily   hydrochlorothiazide  12.5 mg Oral Daily   LORazepam  0-4 mg Intravenous Q12H  losartan  100 mg Oral Daily   multivitamin with minerals  1 tablet Oral Daily   nicotine  14 mg Transdermal Daily   thiamine  100 mg Oral Daily   Or   thiamine  100 mg Intravenous Daily   Vitamin D (Ergocalciferol)  50,000 Units Oral Q7 days    PRN meds: bisacodyl, diphenhydrAMINE, LORazepam **OR** LORazepam, methocarbamol **OR** methocarbamol (ROBAXIN) IV, metoCLOPramide **OR** metoCLOPramide (REGLAN) injection, morphine injection, ondansetron **OR** ondansetron (ZOFRAN) IV, oxyCODONE, polyethylene glycol   Antimicrobials: Anti-infectives (From admission, onward)    Start     Dose/Rate Route Frequency Ordered Stop   02/07/22 2200  ceFAZolin (ANCEF) IVPB 2g/100 mL premix         2 g 200 mL/hr over 30 Minutes Intravenous Every 8 hours 02/07/22 1629 02/08/22 1349   02/07/22 1300  ceFAZolin (ANCEF) IVPB 2g/100 mL premix        2 g 200 mL/hr over 30 Minutes Intravenous On call to O.R. 02/07/22 1253 02/07/22 1443       Objective: Vitals:   02/09/22 0521 02/09/22 0730  BP: 139/79 (!) 146/80  Pulse: 70 64  Resp: 14 16  Temp: 98 F (36.7 C) 99.3 F (37.4 C)  SpO2: 99% 99%   No intake or output data in the 24 hours ending 02/09/22 1413  Filed Weights   02/07/22 0141  Weight: 73.8 kg   Weight change:  Body mass index is 22.07 kg/m.   Physical Exam: General exam: [name].  Not in physical distress Skin: No rashes, lesions or ulcers. HEENT: Atraumatic, normocephalic, no obvious bleeding Lungs: Clear to auscultation bilaterally CVS: Regular rate and rhythm, no murmur GI/Abd soft, nontender, nondistended, bowel sounds CNS: Alert, awake, oriented x3.  Not in alcohol withdrawal Psychiatry: Mood appropriate Extremities: No pedal edema, no calf tenderness  Data Review: I have personally reviewed the laboratory data and studies available.  F/u labs ordered Unresulted Labs (From admission, onward)     Start     Ordered   02/14/22 0500  Creatinine, serum  (enoxaparin (LOVENOX)    CrCl >/= 30 ml/min)  Weekly,   R     Comments: while on enoxaparin therapy    02/07/22 1629   02/08/22 0500  Basic metabolic panel  Daily,   R      02/07/22 1629   02/07/22 0858  Urine rapid drug screen (hosp performed)  ONCE - STAT,   STAT        02/07/22 0859            Signed, Lorin Glass, MD [facility] 02/09/2022

## 2022-02-10 ENCOUNTER — Encounter (HOSPITAL_COMMUNITY): Payer: Self-pay | Admitting: Student

## 2022-02-10 LAB — BASIC METABOLIC PANEL
Anion gap: 8 (ref 5–15)
BUN: 12 mg/dL (ref 6–20)
CO2: 28 mmol/L (ref 22–32)
Calcium: 8.6 mg/dL — ABNORMAL LOW (ref 8.9–10.3)
Chloride: 97 mmol/L — ABNORMAL LOW (ref 98–111)
Creatinine, Ser: 1.01 mg/dL (ref 0.61–1.24)
GFR, Estimated: >60 mL/min (ref >60)
Glucose, Bld: 111 mg/dL — ABNORMAL HIGH (ref 70–99)
Potassium: 3.5 mmol/L (ref 3.5–5.1)
Sodium: 133 mmol/L — ABNORMAL LOW (ref 135–145)

## 2022-02-10 NOTE — Progress Notes (Signed)
PROGRESS NOTE  Derrick Andrade  DOB: [birth year]  PCP: Storm Frisk, MD DVV:[id number]  DOA: 02/07/2022  LOS: 3 days  Hospital Day: 4  Brief narrative: Derrick Andrade is a 61 y.o. male with PMH significant for chronic alcoholism, chronic smoking, hypertension. Patient was brought to the ED on 2/24 by EMS after falling while intoxicated.  He started having right hip and right knee pain. X-ray showed right hip fracture. Orthopedics consulted Admitted to hospitalist service Patient underwent cephalomedullary nailing of right hip  Subjective: Patient was seen and examined this morning.   Propped up in bed.  Not in distress.  On low-flow oxygen.    Principal Problem:   Closed right hip fracture (HCC) Active Problems:   Primary hypertension   Tobacco use   Alcohol dependence (HCC)    Assessment and Plan: Closed right hip fracture   -Secondary to a fall while intoxicated.   -2/24, underwent cephalomedullary nailing. -Pain meds and DVT prophylaxis per orthopedics. -Currently on Robaxin, Tylenol, oxycodone and morphine as needed -At discharge, Per orthopedics, patient will continue Percocet 5-325 mg q 6 hours PRN for pain control -Eliquis 2.5 mg twice daily x30 days for DVT prophylaxis -Patient will follow-up with orthopedics as an outpatient for 2 weeks for repeat x-rays and wound check.  Vitamin D deficiency -Significantly low vitamin D level at 8. -Continue Vit D2 supplementation 50,000 IU q. 7 days x 8 weeks   Chronic alcohol dependence   At risk of withdrawal -Patient lives with his niece.  Reportedly drinks number of beers a day. -Blood alcohol level on admission was elevated to 258 -Currently on CIWA protocol with as needed IV Ativan.    Tobacco use  -Encourage cessation.   -Patch ordered  Primary hypertension  -Continue Norvasc, Hyzaar  Impaired mobility -PT eval obtained.  SNF recommended.  Patient has no medical insurance.  PT to reeval today.  I am hoping  that he is able to show better function today.  Mobility: PT eval obtained Goals of care   Code Status: Full Code    Nutritional status:  Body mass index is 22.07 kg/m.  Nutrition Problem: Increased nutrient needs Etiology: post-op healing, hip fracture Signs/Symptoms: estimated needs Diet:  Diet Order             Diet Heart Room service appropriate? Yes; Fluid consistency: Thin  Diet effective now                   DVT prophylaxis:  enoxaparin (LOVENOX) injection 40 mg Start: 02/08/22 0800 SCDs Start: 02/07/22 1630 SCDs Start: 02/07/22 0858   Antimicrobials: None Fluid: Not on IV fluid Consultants: Orthopedics Family Communication: None at bedside  Status is: Inpatient  Continue in-hospital care because: Pending reevaluation by PT today Level of care: Med-Surg   Dispo: The patient is from: Home              Anticipated d/c is to: SNF versus home with home with PT              Patient currently is medically stable to d/c.   Difficult to place patient No     Infusions:   methocarbamol (ROBAXIN) IV      Scheduled Meds:  acetaminophen  1,000 mg Oral Q6H   amLODipine  10 mg Oral Daily   buPROPion  150 mg Oral Daily   docusate sodium  100 mg Oral BID   enoxaparin (LOVENOX) injection  40 mg Subcutaneous Q24H  feeding supplement  237 mL Oral BID BM   folic acid  1 mg Oral Daily   hydrochlorothiazide  12.5 mg Oral Daily   LORazepam  0-4 mg Intravenous Q12H   losartan  100 mg Oral Daily   multivitamin with minerals  1 tablet Oral Daily   nicotine  14 mg Transdermal Daily   thiamine  100 mg Oral Daily   Or   thiamine  100 mg Intravenous Daily   Vitamin D (Ergocalciferol)  50,000 Units Oral Q7 days    PRN meds: bisacodyl, diphenhydrAMINE, methocarbamol **OR** methocarbamol (ROBAXIN) IV, metoCLOPramide **OR** metoCLOPramide (REGLAN) injection, morphine injection, ondansetron **OR** ondansetron (ZOFRAN) IV, oxyCODONE, polyethylene glycol    Antimicrobials: Anti-infectives (From admission, onward)    Start     Dose/Rate Route Frequency Ordered Stop   02/07/22 2200  ceFAZolin (ANCEF) IVPB 2g/100 mL premix        2 g 200 mL/hr over 30 Minutes Intravenous Every 8 hours 02/07/22 1629 02/08/22 1349   02/07/22 1300  ceFAZolin (ANCEF) IVPB 2g/100 mL premix        2 g 200 mL/hr over 30 Minutes Intravenous On call to O.R. 02/07/22 1253 02/07/22 1443       Objective: Vitals:   02/10/22 0551 02/10/22 0839  BP: (!) 151/80 124/70  Pulse: (!) 59 65  Resp: 16 16  Temp: (!) 97.4 F (36.3 C) 98.1 F (36.7 C)  SpO2: 98% 100%    Intake/Output Summary (Last 24 hours) at 02/10/2022 1205 Last data filed at 02/09/2022 1925 Gross per 24 hour  Intake --  Output 275 ml  Net -275 ml    Filed Weights   02/07/22 0141  Weight: 73.8 kg   Weight change:  Body mass index is 22.07 kg/m.   Physical Exam: General exam: [name].  Not in physical distress Skin: No rashes, lesions or ulcers. HEENT: Atraumatic, normocephalic, no obvious bleeding Lungs: Clear to auscultation bilaterally CVS: Regular rate and rhythm, no murmur GI/Abd soft, nontender, nondistended, bowel sounds CNS: Alert, awake, oriented x3.  Not in alcohol withdrawal Psychiatry: Mood appropriate Extremities: No pedal edema, no calf tenderness  Data Review: I have personally reviewed the laboratory data and studies available.  F/u labs ordered Unresulted Labs (From admission, onward)     Start     Ordered   02/14/22 0500  Creatinine, serum  (enoxaparin (LOVENOX)    CrCl >/= 30 ml/min)  Weekly,   R     Comments: while on enoxaparin therapy    02/07/22 1629   02/07/22 0858  Urine rapid drug screen (hosp performed)  ONCE - STAT,   STAT        02/07/22 0859            Signed, Lorin Glass, MD [facility] 02/10/2022

## 2022-02-10 NOTE — Progress Notes (Signed)
Physical Therapy Treatment Patient Details Name: Derrick Andrade MRN: [id number] DOB: 12/09/1961 Today's Date: 02/10/2022   History of Present Illness Pt is a 61 y/o M presenting to ED on 2/24 after mechanical fall, c/o R hip pain. Found to have R intertrochanteric femur fx. S/p IM nail on 2/24. PMH includes alcohol and tobacco dependence and HTN.    PT Comments    Pt is making progress towards his physical therapy goals. He tends to be argumentative and not receptive to education. Pt ambulating 40 feet with a walker, with supervision-min guard assist, utilizing a step to pattern. Displays decreased R step length and foot clearance. Needs encouragement to continue progression. Discussed plan for tomorrow is to ambulate TID; mobility specialist on board. Will benefit from continued acute PT until he can walk a household distance and perform ADL's. Sounds like pt has limited support at home from niece; sent CSW, Marya Amsler a message.    Recommendations for follow up therapy are one component of a multi-disciplinary discharge planning process, led by the attending physician.  Recommendations may be updated based on patient status, additional functional criteria and insurance authorization.  Follow Up Recommendations  Home health PT     Assistance Recommended at Discharge Intermittent Supervision/Assistance  Patient can return home with the following A little help with walking and/or transfers;A little help with bathing/dressing/bathroom;Assist for transportation;Help with stairs or ramp for entrance   Equipment Recommendations  Rolling walker (2 wheels);BSC/3in1    Recommendations for Other Services       Precautions / Restrictions Precautions Precautions: Fall Restrictions Weight Bearing Restrictions: Yes RLE Weight Bearing: Weight bearing as tolerated     Mobility  Bed Mobility               General bed mobility comments: OOB in chair    Transfers Overall transfer level: Needs  assistance Equipment used: Rolling walker (2 wheels) Transfers: Sit to/from Stand Sit to Stand: Min guard           General transfer comment: slow to rise, pushing off arms of chair    Ambulation/Gait Ambulation/Gait assistance: Supervision, Min guard Gait Distance (Feet): 40 Feet Assistive device: Rolling walker (2 wheels) Gait Pattern/deviations: Step-to pattern, Decreased step length - right, Decreased stance time - right, Decreased weight shift to right, Antalgic, Trunk flexed Gait velocity: decreased Gait velocity interpretation: <1.8 ft/sec, indicate of risk for recurrent falls   General Gait Details: Cues for upright posture, sequencing, larger R step length, rolling walker rather than picking it up   Stairs             Wheelchair Mobility    Modified Rankin (Stroke Patients Only)       Balance Overall balance assessment: Needs assistance Sitting-balance support: No upper extremity supported, Feet supported Sitting balance-Leahy Scale: Fair     Standing balance support: Bilateral upper extremity supported, Reliant on assistive device for balance Standing balance-Leahy Scale: Poor                              Cognition Arousal/Alertness: Awake/alert Behavior During Therapy: WFL for tasks assessed/performed Overall Cognitive Status: Impaired/Different from baseline Area of Impairment: Following commands, Safety/judgement                       Following Commands: Follows one step commands with increased time Safety/Judgement: Decreased awareness of deficits     General Comments: Argumentative with PT and  not receptive to education        Exercises General Exercises - Lower Extremity Long Arc Quad: AAROM, Right, 10 reps, Seated    General Comments General comments (skin integrity, edema, etc.): SpO2 98% on RA      Pertinent Vitals/Pain Pain Assessment Pain Assessment: Faces Faces Pain Scale: Hurts even more Pain  Location: RLE Pain Descriptors / Indicators: Sore Pain Intervention(s): Limited activity within patient's tolerance, Monitored during session, Premedicated before session    Home Living                          Prior Function            PT Goals (current goals can now be found in the care plan section) Acute Rehab PT Goals Patient Stated Goal: to reduce pain Potential to Achieve Goals: Good Progress towards PT goals: Progressing toward goals    Frequency    Min 5X/week      PT Plan Discharge plan needs to be updated;Frequency needs to be updated    Co-evaluation              AM-PAC PT "6 Clicks" Mobility   Outcome Measure  Help needed turning from your back to your side while in a flat bed without using bedrails?: A Little Help needed moving from lying on your back to sitting on the side of a flat bed without using bedrails?: A Little Help needed moving to and from a bed to a chair (including a wheelchair)?: A Little Help needed standing up from a chair using your arms (e.g., wheelchair or bedside chair)?: A Little Help needed to walk in hospital room?: A Little Help needed climbing 3-5 steps with a railing? : A Lot 6 Click Score: 17    End of Session Equipment Utilized During Treatment: Gait belt Activity Tolerance: Patient limited by pain Patient left: in chair;with call bell/phone within reach;with chair alarm set Nurse Communication: Mobility status PT Visit Diagnosis: Other abnormalities of gait and mobility (R26.89);Muscle weakness (generalized) (M62.81);Pain Pain - Right/Left: Right Pain - part of body: Hip     Time: [id number] PT Time Calculation (min) (ACUTE ONLY): 19 min  Charges:  $Gait Training: 8-22 mins                     Wyona Almas, PT, DPT Acute Rehabilitation Services Pager [phone] Office [phone]    Deno Etienne 02/10/2022, 5:04 PM

## 2022-02-10 NOTE — TOC CAGE-AID Note (Signed)
Transition of Care [facility]) - CAGE-AID Screening   Patient Details  Name: RUTGER SALTON MRN: [id number] Date of Birth: Mar 04, 1961  [facility]) CM/SW Contact:    Katha Hamming, RN Phone Number:[phone] 02/10/2022, 8:21 PM   Clinical Narrative:  Patient presents to the hospital after a fall resulting in right hip pain, right hip fracture. CAGE AID completed, patient denies any alcohol or drug use to this RN. However, known hx of alcohol dependence and ethanol level 258 upon admission. CAGEAID score 0 does not accurately reflect clinical picture. CIWAs ordered and have been elevated today (max 9 at time of this note).   Resources added to AVS for discharge. Family believes patient would benefit from reduction in alcohol use.  CAGE-AID Screening:    Have You Ever Felt You Ought to Cut Down on Your Drinking or Drug Use?: No Have People Annoyed You By [employer] Your Drinking Or Drug Use?: No Have You Felt Bad Or Guilty About Your Drinking Or Drug Use?: No Have You Ever Had a Drink or Used Drugs First Thing In The Morning to Steady Your Nerves or to Get Rid of a Hangover?: No CAGE-AID Score: 0  Substance Abuse Education Offered: Yes (patient denies alcohol and drug use upon assessment, however ethanol level positive upon admission to hospital, family reports he drinks too much and would benefit from resources. Score 0 does not accurately reflect clinical picture. CIWAs)  Substance abuse interventions: Transport planner (AVS)

## 2022-02-10 NOTE — Progress Notes (Addendum)
Occupational Therapy Treatment Patient Details Name: Derrick Andrade MRN: [id number] DOB: 02/01/1961 Today's Date: 02/10/2022   History of present illness Pt is a 61 y/o M presenting to ED on 2/24 after mechanical fall, c/o R hip pain. Found to have R intertrochanteric femur fx. S/p IM nail on 2/24. PMH includes alcohol and tobacco dependence and HTN.   OT comments  Pt progressing towards goals this session, able to ambulate household distance and simulate LB dressing and toilet transfer in room with min A using RW. Pt SpO2 96% at rest on RA, decreased to 90% with short ambulation distance, recovering once seated. Discussed and educated pt on importance of mobility in order to strengthen RLE, pt seems to have decreased awareness, difficulty correlating increased mobility to increased RLE strength and endurance. Pt presenting with impairments listed below, will follow acutely. Continue to recommend SNF at d/c, if SNF is not a possible d/c option, consider HHOT if pt can get to a min guard-supervision level for ADLs, as he does not have anyone to provide physical assistance at home.   Recommendations for follow up therapy are one component of a multi-disciplinary discharge planning process, led by the attending physician.  Recommendations may be updated based on patient status, additional functional criteria and insurance authorization.    Follow Up Recommendations  Skilled nursing-short term rehab (<3 hours/day) (vs HHOT)    Assistance Recommended at Discharge Intermittent Supervision/Assistance  Patient can return home with the following  A little help with walking and/or transfers;A lot of help with bathing/dressing/bathroom;Assistance with cooking/housework;Help with stairs or ramp for entrance;Assist for transportation   Equipment Recommendations  BSC/3in1    Recommendations for Other Services PT consult    Precautions / Restrictions Precautions Precautions: Fall Restrictions Weight  Bearing Restrictions: Yes RLE Weight Bearing: Weight bearing as tolerated       Mobility Bed Mobility               General bed mobility comments: pt up in chair upon arrival    Transfers Overall transfer level: Needs assistance Equipment used: Rolling walker (2 wheels) Transfers: Sit to/from Stand Sit to Stand: Min assist           General transfer comment: cues for hand placement, scooting to edge of chair and pushing up with 1-2 hands     Balance Overall balance assessment: Needs assistance Sitting-balance support: No upper extremity supported, Feet supported Sitting balance-Leahy Scale: Fair Sitting balance - Comments: able to reach down for sock without LOB   Standing balance support: Bilateral upper extremity supported, Reliant on assistive device for balance Standing balance-Leahy Scale: Poor                             ADL either performed or assessed with clinical judgement   ADL Overall ADL's : Needs assistance/impaired                     Lower Body Dressing: Minimal assistance;Sitting/lateral leans;Moderate assistance Lower Body Dressing Details (indicate cue type and reason): simulated with sock and pillow case sitting up in chair Toilet Transfer: Minimal assistance;Rolling walker (2 wheels);Ambulation;Regular Teacher, adult education Details (indicate cue type and reason): simulated ambulating in room         Functional mobility during ADLs: Minimal assistance;Rolling walker (2 wheels);Cueing for sequencing;Cueing for safety      Extremity/Trunk Assessment Upper Extremity Assessment Upper Extremity Assessment: Overall WFL for tasks assessed  Lower Extremity Assessment Lower Extremity Assessment: Defer to PT evaluation        Vision   Vision Assessment?: No apparent visual deficits   Perception Perception Perception: Not tested   Praxis Praxis Praxis: Not tested    Cognition Arousal/Alertness:  Awake/alert Behavior During Therapy: WFL for tasks assessed/performed Overall Cognitive Status: Impaired/Different from baseline Area of Impairment: Following commands, Safety/judgement, Awareness                       Following Commands: Follows one step commands with increased time Safety/Judgement: Decreased awareness of deficits Awareness: Anticipatory   General Comments: pt still demonstrates poor insight to functional deficits. Is aware that he needs to strengthen BLE, however does not understand the correlation between mobilizing and working with therapy will help strengthen BLE        Exercises      Shoulder Instructions       General Comments SpO2 95% resting in chair, dropping to 90% when ambulating on RA    Pertinent Vitals/ Pain       Pain Assessment Pain Assessment: Faces Pain Score: 5  Faces Pain Scale: Hurts little more Pain Location: RLE Pain Descriptors / Indicators: Sore Pain Intervention(s): Limited activity within patient's tolerance, Monitored during session, Repositioned  Home Living                                          Prior Functioning/Environment              Frequency  Min 2X/week        Progress Toward Goals  OT Goals(current goals can now be found in the care plan section)  Progress towards OT goals: Progressing toward goals  Acute Rehab OT Goals Patient Stated Goal: to get stronger OT Goal Formulation: With patient Time For Goal Achievement: 02/22/22 Potential to Achieve Goals: Fair ADL Goals Pt Will Perform Upper Body Dressing: with supervision;sitting Pt Will Perform Lower Body Dressing: with mod assist;sitting/lateral leans;sit to/from stand Pt Will Transfer to Toilet: with mod assist;squat pivot transfer;stand pivot transfer;bedside commode Pt Will Perform Tub/Shower Transfer: rolling walker;shower seat;ambulating;with mod assist  Plan      Co-evaluation                 AM-PAC OT  "6 Clicks" Daily Activity     Outcome Measure   Help from another person eating meals?: None Help from another person taking care of personal grooming?: A Little Help from another person toileting, which includes using toliet, bedpan, or urinal?: A Lot Help from another person bathing (including washing, rinsing, drying)?: A Lot Help from another person to put on and taking off regular upper body clothing?: A Little Help from another person to put on and taking off regular lower body clothing?: A Lot 6 Click Score: 16    End of Session Equipment Utilized During Treatment: Rolling walker (2 wheels)  OT Visit Diagnosis: Unsteadiness on feet (R26.81);Other abnormalities of gait and mobility (R26.89);Muscle weakness (generalized) (M62.81);History of falling (Z91.81);Pain Pain - Right/Left: Right Pain - part of body: Hip;Leg   Activity Tolerance Patient tolerated treatment well;Patient limited by fatigue   Patient Left in chair;with chair alarm set;with call bell/phone within reach   Nurse Communication Mobility status        Time: 5436-0677 OT Time Calculation (min): 24 min  Charges: OT General Charges $OT  Visit: 1 Visit OT Treatments $Self Care/Home Management : 8-22 mins $Therapeutic Activity: 8-22 mins  Alfonzo Beers, OTD, OTR/L Acute Rehab [phone]   Mayer Masker 02/10/2022, 2:52 PM

## 2022-02-10 NOTE — Plan of Care (Signed)
VSS. Currently on room air, weaned off today. C/o pain with ambulation, given PRN pain meds. OOB to chair most of the day. 1 w/ walker. Surgical site- OTA, clean and dry. Call bell within reach. Chair alarm on at the moment.   Problem: Clinical Measurements: Goal: Ability to maintain clinical measurements within normal limits will improve Outcome: Progressing Goal: Will remain free from infection Outcome: Progressing Goal: Diagnostic test results will improve Outcome: Progressing Goal: Respiratory complications will improve Outcome: Progressing Goal: Cardiovascular complication will be avoided Outcome: Progressing   Problem: Elimination: Goal: Will not experience complications related to bowel motility Outcome: Progressing Goal: Will not experience complications related to urinary retention Outcome: Progressing   Problem: Pain Managment: Goal: General experience of comfort will improve Outcome: Progressing   Problem: Safety: Goal: Ability to remain free from injury will improve Outcome: Progressing   Problem: Skin Integrity: Goal: Risk for impaired skin integrity will decrease Outcome: Progressing

## 2022-02-11 ENCOUNTER — Other Ambulatory Visit (HOSPITAL_COMMUNITY): Payer: Self-pay

## 2022-02-11 LAB — GLUCOSE, CAPILLARY: Glucose-Capillary: 161 mg/dL — ABNORMAL HIGH (ref 70–99)

## 2022-02-11 MED ORDER — OXYCODONE-ACETAMINOPHEN 5-325 MG PO TABS
1.0000 | ORAL_TABLET | Freq: Four times a day (QID) | ORAL | 0 refills | Status: AC | PRN
  Filled 2022-02-11: qty 20, 5d supply, fill #0

## 2022-02-11 MED ORDER — HYDROCHLOROTHIAZIDE 12.5 MG PO TABS
12.5000 mg | ORAL_TABLET | Freq: Every day | ORAL | 0 refills | Status: DC
  Filled 2022-02-11: qty 30, 30d supply, fill #0

## 2022-02-11 MED ORDER — VITAMIN D (ERGOCALCIFEROL) 1.25 MG (50000 UNIT) PO CAPS: 50000.0000 [IU] | ORAL_CAPSULE | ORAL | 0 refills | Status: AC

## 2022-02-11 MED ORDER — METHOCARBAMOL 500 MG PO TABS
500.0000 mg | ORAL_TABLET | Freq: Four times a day (QID) | ORAL | 0 refills | Status: AC | PRN
  Filled 2022-02-11: qty 28, 7d supply, fill #0

## 2022-02-11 MED ORDER — AMLODIPINE BESYLATE 10 MG PO TABS
10.0000 mg | ORAL_TABLET | Freq: Every day | ORAL | 0 refills | Status: DC
  Filled 2022-02-11: qty 30, 30d supply, fill #0

## 2022-02-11 MED ORDER — LOSARTAN POTASSIUM 100 MG PO TABS
100.0000 mg | ORAL_TABLET | Freq: Every day | ORAL | 0 refills | Status: DC
  Filled 2022-02-11: qty 30, 30d supply, fill #0

## 2022-02-11 MED ORDER — BUPROPION HCL ER (SR) 150 MG PO TB12
150.0000 mg | ORAL_TABLET | Freq: Every day | ORAL | 0 refills | Status: AC
  Filled 2022-02-11: qty 30, 30d supply, fill #0

## 2022-02-11 MED ORDER — DOCUSATE SODIUM 100 MG PO CAPS
100.0000 mg | ORAL_CAPSULE | Freq: Two times a day (BID) | ORAL | 0 refills | Status: AC
  Filled 2022-02-11: qty 60, 30d supply, fill #0

## 2022-02-11 MED ORDER — APIXABAN 2.5 MG PO TABS: 2.5000 mg | ORAL_TABLET | Freq: Two times a day (BID) | ORAL | 0 refills | Status: AC

## 2022-02-11 NOTE — Progress Notes (Signed)
Occupational Therapy Treatment Patient Details Name: Derrick Andrade MRN: [id number] DOB: [birth year] Today's Date: 02/11/2022   History of present illness Pt is a 61 y/o M presenting to ED on 2/24 after mechanical fall, c/o R hip pain. Found to have R intertrochanteric femur fx. S/p IM nail on 2/24. PMH includes alcohol and tobacco dependence and HTN.   OT comments  Pt required CGA with functional mobility with FW and cues for hand positioning as attempting to reach out for rail and letting go of walker. Pt required rest sitting at EOB and then min assist from elevated surface and CGA to transfer to chair level. Pt was further educated about AE for LE and reported " I have about 3 reacher sat home and a hospital bed but it outside and just needs to be set up". Pt would report "they have family help but then also report they get in the way with all the kids and the toys." Pt currently with functional limitations due to the deficits listed below (see OT Problem List).  Pt will benefit from skilled OT to increase their safety and independence with ADL and functional mobility for ADL to facilitate discharge to venue listed below.     Recommendations for follow up therapy are one component of a multi-disciplinary discharge planning process, led by the attending physician.  Recommendations may be updated based on patient status, additional functional criteria and insurance authorization.    Follow Up Recommendations  Home health OT (pt does not have insurance for SNF stay)    Assistance Recommended at Discharge    Patient can return home with the following  A little help with walking and/or transfers;A lot of help with bathing/dressing/bathroom;Assistance with cooking/housework;Help with stairs or ramp for entrance;Assist for transportation   Equipment Recommendations  BSC/3in1    Recommendations for Other Services      Precautions / Restrictions Precautions Precautions:  Fall Restrictions Weight Bearing Restrictions: Yes RLE Weight Bearing: Weight bearing as tolerated       Mobility Bed Mobility Overal bed mobility:  (Pt presented ambulating with nursing)                  Transfers Overall transfer level: Needs assistance Equipment used: Rolling walker (2 wheels) Transfers: Sit to/from Stand Sit to Stand: Min guard                 Balance Overall balance assessment: Needs assistance Sitting-balance support: No upper extremity supported, Feet supported Sitting balance-Leahy Scale: Fair     Standing balance support: Bilateral upper extremity supported, During functional activity Standing balance-Leahy Scale: Poor                             ADL either performed or assessed with clinical judgement   ADL Overall ADL's : Needs assistance/impaired Eating/Feeding: Set up;Sitting   Grooming: Sitting;Set up   Upper Body Bathing: Set up;Sitting   Lower Body Bathing: Maximal assistance;Cueing for safety;Cueing for sequencing;Sit to/from stand   Upper Body Dressing : Set up;Adhering to UE precautions;Cueing for safety;Cueing for sequencing;Sitting   Lower Body Dressing: Maximal assistance;Cueing for safety;Cueing for sequencing;Sit to/from stand   Toilet Transfer: Min guard;Cueing for safety;Cueing for sequencing;Rolling walker (2 wheels)   Toileting- Water quality scientist and Hygiene: Moderate assistance;Cueing for safety;Cueing for sequencing;Sit to/from stand       Functional mobility during ADLs: Min guard;Cueing for safety;Cueing for sequencing;Rolling walker (2 wheels)  Extremity/Trunk Assessment Upper Extremity Assessment Upper Extremity Assessment: Overall WFL for tasks assessed   Lower Extremity Assessment Lower Extremity Assessment: Defer to PT evaluation        Vision   Vision Assessment?: No apparent visual deficits   Perception     Praxis      Cognition Arousal/Alertness:  Awake/alert Behavior During Therapy: WFL for tasks assessed/performed Overall Cognitive Status: Impaired/Different from baseline Area of Impairment: Following commands, Safety/judgement                       Following Commands: Follows one step commands with increased time Safety/Judgement: Decreased awareness of deficits Awareness: Anticipatory            Exercises      Shoulder Instructions       General Comments room air 98%    Pertinent Vitals/ Pain       Pain Assessment Pain Assessment: 0-10 Pain Score: 10-Worst pain ever Pain Location: RLE Pain Descriptors / Indicators: Sore Pain Intervention(s): Limited activity within patient's tolerance, Monitored during session, Repositioned  Home Living                                          Prior Functioning/Environment              Frequency  Min 2X/week        Progress Toward Goals  OT Goals(current goals can now be found in the care plan section)  Progress towards OT goals: Progressing toward goals  Acute Rehab OT Goals Patient Stated Goal: Pt reported to get walking OT Goal Formulation: With patient Time For Goal Achievement: 02/22/22 Potential to Achieve Goals: Fair ADL Goals Pt Will Perform Upper Body Dressing: with supervision;sitting Pt Will Perform Lower Body Dressing: with mod assist;sitting/lateral leans;sit to/from stand Pt Will Transfer to Toilet: with mod assist;squat pivot transfer;stand pivot transfer;bedside commode Pt Will Perform Tub/Shower Transfer: rolling walker;shower seat;ambulating;with mod assist  Plan      Co-evaluation                 AM-PAC OT "6 Clicks" Daily Activity     Outcome Measure   Help from another person eating meals?: None Help from another person taking care of personal grooming?: A Little Help from another person toileting, which includes using toliet, bedpan, or urinal?: A Lot Help from another person bathing (including  washing, rinsing, drying)?: A Lot Help from another person to put on and taking off regular upper body clothing?: A Little Help from another person to put on and taking off regular lower body clothing?: A Lot 6 Click Score: 16    End of Session Equipment Utilized During Treatment: Gait belt;Rolling walker (2 wheels)  OT Visit Diagnosis: Unsteadiness on feet (R26.81);Other abnormalities of gait and mobility (R26.89);Muscle weakness (generalized) (M62.81);History of falling (Z91.81);Pain Pain - Right/Left: Right Pain - part of body: Hip;Leg   Activity Tolerance Patient limited by pain   Patient Left in chair;with call bell/phone within reach;with chair alarm set   Nurse Communication Mobility status        Time: 8099-8338 OT Time Calculation (min): 27 min  Charges: OT General Charges $OT Visit: 1 Visit OT Treatments $Self Care/Home Management : 23-37 mins  Alphia Moh OTR/L  Acute Rehab Services  [phone] office number [phone] pager number   Alphia Moh 02/11/2022, 8:58 AM

## 2022-02-11 NOTE — Progress Notes (Signed)
Physical Therapy Treatment Patient Details Name: Derrick Andrade MRN: [id number] DOB: Feb 08, 1961 Today's Date: 02/11/2022   History of Present Illness Pt is a 61 y/o M presenting to ED on 2/24 after mechanical fall, c/o R hip pain. Found to have R intertrochanteric femur fx. S/p IM nail on 2/24. PMH includes alcohol and tobacco dependence and HTN.    PT Comments    Pt was seen for progressing his mobility and demonstrates struggle to tolerate any movement.  Reviewed exercise with help and application of ice for pain.  Pt verbalizes relief with this and encouraged him to ask for more when this melts.  Pt is distracted by the perception of his care, and will need to be asked for needs to make sure ice and meds are meeting his needs.  Follow along for acute PT goals.  Recommend HHPT for needed assistance at home, pt reports he will have others to help him at home.   Recommendations for follow up therapy are one component of a multi-disciplinary discharge planning process, led by the attending physician.  Recommendations may be updated based on patient status, additional functional criteria and insurance authorization.  Follow Up Recommendations  Home health PT     Assistance Recommended at Discharge Intermittent Supervision/Assistance  Patient can return home with the following A little help with walking and/or transfers;A little help with bathing/dressing/bathroom;Assist for transportation;Help with stairs or ramp for entrance;Assistance with cooking/housework   Equipment Recommendations  Rolling walker (2 wheels);BSC/3in1    Recommendations for Other Services       Precautions / Restrictions Precautions Precautions: Fall Restrictions Weight Bearing Restrictions: Yes RLE Weight Bearing: Weight bearing as tolerated     Mobility  Bed Mobility Overal bed mobility: Needs Assistance Bed Mobility: Rolling Rolling: Min assist              Transfers                    General transfer comment: declines to do these    Ambulation/Gait               General Gait Details: declines   Stairs             Wheelchair Mobility    Modified Rankin (Stroke Patients Only)       Balance     Sitting balance-Leahy Scale: Fair                                      Cognition Arousal/Alertness: Awake/alert Behavior During Therapy: WFL for tasks assessed/performed Overall Cognitive Status: Impaired/Different from baseline Area of Impairment: Problem solving, Awareness, Attention                   Current Attention Level: Selective   Following Commands: Follows one step commands with increased time Safety/Judgement: Decreased awareness of safety, Decreased awareness of deficits Awareness: Intellectual Problem Solving: Slow processing          Exercises General Exercises - Lower Extremity Ankle Circles/Pumps: AROM, 5 reps Quad Sets: AROM, 10 reps Gluteal Sets: AROM, 10 reps Heel Slides: AROM, AAROM, 10 reps Hip ABduction/ADduction: AROM, AAROM, 10 reps Straight Leg Raises: AROM, AAROM, 10 reps Hip Flexion/Marching: AROM, AAROM, 10 reps    General Comments General comments (skin integrity, edema, etc.): O2 sats 97% and pulse 75      Pertinent Vitals/Pain Pain Assessment Pain Assessment: Faces Faces  Pain Scale: Hurts whole lot Breathing: normal Negative Vocalization: occasional moan/groan, low speech, negative/disapproving quality Facial Expression: smiling or inexpressive Body Language: tense, distressed pacing, fidgeting Consolability: no need to console PAINAD Score: 2 Pain Location: RLE Pain Descriptors / Indicators: Operative site guarding Pain Intervention(s): Limited activity within patient's tolerance, Monitored during session, Premedicated before session, Repositioned, Ice applied    Home Living                          Prior Function            PT Goals (current goals can  now be found in the care plan section) Acute Rehab PT Goals Patient Stated Goal: to reduce pain Progress towards PT goals: Not progressing toward goals - comment    Frequency    Min 5X/week      PT Plan      Co-evaluation              AM-PAC PT "6 Clicks" Mobility   Outcome Measure  Help needed turning from your back to your side while in a flat bed without using bedrails?: A Little Help needed moving from lying on your back to sitting on the side of a flat bed without using bedrails?: A Little Help needed moving to and from a bed to a chair (including a wheelchair)?: A Little Help needed standing up from a chair using your arms (e.g., wheelchair or bedside chair)?: A Little Help needed to walk in hospital room?: A Little Help needed climbing 3-5 steps with a railing? : A Lot 6 Click Score: 17    End of Session   Activity Tolerance: Patient limited by pain Patient left: in chair;with call bell/phone within reach;with chair alarm set Nurse Communication: Mobility status PT Visit Diagnosis: Other abnormalities of gait and mobility (R26.89);Muscle weakness (generalized) (M62.81);Pain Pain - Right/Left: Right Pain - part of body: Hip     Time: 4562-5638 PT Time Calculation (min) (ACUTE ONLY): 28 min  Charges:  $Therapeutic Exercise: 8-22 mins $Therapeutic Activity: 8-22 mins     Ivar Drape 02/11/2022, 5:26 PM  Samul Dada, PT PhD Acute Rehab Dept. Number: [facility] [phone] and [facility] [phone]

## 2022-02-11 NOTE — Progress Notes (Signed)
Mobility Specialist Progress Note    02/11/22 1721  Mobility  Bed Position Chair  Activity Ambulated with assistance in room  Level of Assistance Contact guard assist, steadying assist  Assistive Device Front wheel walker  RLE Weight Bearing WBAT  Distance Ambulated (ft) 12 ft  Activity Response Tolerated fair  $Mobility charge 1 Mobility   Pt received in chair and agreeable. C/o 10/10 pain and saying he wanted to go to rehab because he "can't go home like this". His main reasoning was pain management. He also wants disability because he says he can't work like this. Pt is frustrated that the doctor thinks he can go home with this much pain. Left in chair with call bell in reach and alarm on.   [facility] Mobility Specialist  M.S. 5N: [phone]

## 2022-02-11 NOTE — Progress Notes (Signed)
PROGRESS NOTE  Derrick Andrade  DOB: 07/02/1961  PCP: Storm Frisk, MD LOV:[id number]  DOA: 02/07/2022  LOS: 4 days  Hospital Day: 5  Brief narrative: Derrick Andrade is a 61 y.o. male with PMH significant for chronic alcoholism, chronic smoking, hypertension. Patient was brought to the ED on 2/24 by EMS after falling while intoxicated.  He started having right hip and right knee pain. X-ray showed right hip fracture. Orthopedics consulted Admitted to hospitalist service Patient underwent cephalomedullary nailing of right hip  Subjective: Patient was seen and examined this morning.   Sitting up in chair.  Not in distress.  Not on supplemental oxygen today.  Principal Problem:   Closed right hip fracture (HCC) Active Problems:   Primary hypertension   Tobacco use   Alcohol dependence (HCC)    Assessment and Plan: Closed right hip fracture   -Secondary to a fall while intoxicated.   -2/24, underwent cephalomedullary nailing. -Pain meds and DVT prophylaxis per orthopedics. -Currently on Robaxin, Tylenol, oxycodone and morphine as needed -At discharge, Per orthopedics, patient will continue Percocet 5-325 mg q 6 hours PRN for pain control -Eliquis 2.5 mg twice daily x30 days for DVT prophylaxis -Patient will follow-up with orthopedics as an outpatient for 2 weeks for repeat x-rays and wound check.  Vitamin D deficiency -Significantly low vitamin D level at 8. -Continue Vit D2 supplementation 50,000 IU q. 7 days x 8 weeks   Chronic alcohol dependence   At risk of withdrawal -Patient lives with his niece.  Reportedly drinks number of beers a day. -Blood alcohol level on admission was elevated to 258 -Currently on CIWA protocol with as needed IV Ativan.  He has not had withdrawal symptoms in the hospital.  Tobacco use  -Encourage cessation.   -Patch ordered  Primary hypertension  -Continue Norvasc, Hyzaar  Impaired mobility -PT eval obtained.  SNF was recommended  initially..  Mobility improved.  Home with PT recommended by PT on 2/27.   Mobility: PT eval obtained Goals of care   Code Status: Full Code    Nutritional status:  Body mass index is 22.07 kg/m.  Nutrition Problem: Increased nutrient needs Etiology: post-op healing, hip fracture Signs/Symptoms: estimated needs Diet:  Diet Order             Diet Heart Room service appropriate? Yes; Fluid consistency: Thin  Diet effective now                   DVT prophylaxis:  enoxaparin (LOVENOX) injection 40 mg Start: 02/08/22 0800 SCDs Start: 02/07/22 1630 SCDs Start: 02/07/22 0858   Antimicrobials: None Fluid: Not on IV fluid Consultants: Orthopedics Family Communication: None at bedside  Status is: Inpatient  Continue in-hospital care because: Patient states he has arrangements to be made at home for better mobility.   Level of care: Med-Surg   Dispo: The patient is from: Home              Anticipated d/c is to: Home with home health PT and DME              Patient currently is medically stable to d/c.   Difficult to place patient No     Infusions:   methocarbamol (ROBAXIN) IV      Scheduled Meds:  acetaminophen  1,000 mg Oral Q6H   amLODipine  10 mg Oral Daily   buPROPion  150 mg Oral Daily   docusate sodium  100 mg Oral BID  enoxaparin (LOVENOX) injection  40 mg Subcutaneous Q24H   feeding supplement  237 mL Oral BID BM   folic acid  1 mg Oral Daily   hydrochlorothiazide  12.5 mg Oral Daily   losartan  100 mg Oral Daily   multivitamin with minerals  1 tablet Oral Daily   nicotine  14 mg Transdermal Daily   thiamine  100 mg Oral Daily   Or   thiamine  100 mg Intravenous Daily   Vitamin D (Ergocalciferol)  50,000 Units Oral Q7 days    PRN meds: bisacodyl, diphenhydrAMINE, methocarbamol **OR** methocarbamol (ROBAXIN) IV, metoCLOPramide **OR** metoCLOPramide (REGLAN) injection, morphine injection, ondansetron **OR** ondansetron (ZOFRAN) IV, oxyCODONE,  polyethylene glycol   Antimicrobials: Anti-infectives (From admission, onward)    Start     Dose/Rate Route Frequency Ordered Stop   02/07/22 2200  ceFAZolin (ANCEF) IVPB 2g/100 mL premix        2 g 200 mL/hr over 30 Minutes Intravenous Every 8 hours 02/07/22 1629 02/08/22 1349   02/07/22 1300  ceFAZolin (ANCEF) IVPB 2g/100 mL premix        2 g 200 mL/hr over 30 Minutes Intravenous On call to O.R. 02/07/22 1253 02/07/22 1443       Objective: Vitals:   02/11/22 0402 02/11/22 0859  BP: (!) 150/75 140/80  Pulse: 74 79  Resp: 18 18  Temp: 98.3 F (36.8 C) 98 F (36.7 C)  SpO2: 96% 96%    Intake/Output Summary (Last 24 hours) at 02/11/2022 1050 Last data filed at 02/11/2022 0559 Gross per 24 hour  Intake --  Output 800 ml  Net -800 ml     Filed Weights   02/07/22 0141  Weight: 73.8 kg   Weight change:  Body mass index is 22.07 kg/m.   Physical Exam: General exam: [name].  Not in physical distress Skin: No rashes, lesions or ulcers. HEENT: Atraumatic, normocephalic, no obvious bleeding Lungs: Clear to auscultation bilaterally CVS: Regular rate and rhythm, no murmur GI/Abd soft, nontender, nondistended, bowel sounds CNS: Alert, awake, oriented x3.  Not in alcohol withdrawal Psychiatry: Mood appropriate Extremities: No pedal edema, no calf tenderness  Data Review: I have personally reviewed the laboratory data and studies available.  F/u labs ordered Unresulted Labs (From admission, onward)     Start     Ordered   02/14/22 0500  Creatinine, serum  (enoxaparin (LOVENOX)    CrCl >/= 30 ml/min)  Weekly,   R     Comments: while on enoxaparin therapy    02/07/22 1629   02/07/22 0858  Urine rapid drug screen (hosp performed)  ONCE - STAT,   STAT        02/07/22 0859            Signed, Lorin Glass, MD [facility] 02/11/2022

## 2022-02-11 NOTE — TOC Transition Note (Addendum)
Transition of Care [facility]) - CM/SW Discharge Note   Patient Details  Name: Derrick Andrade MRN: [id number] Date of Birth: 05-25-1961  Transition of Care [facility]) CM/SW Contact:  Epifanio Lesches, RN Phone Number: 02/11/2022, 3:59 PM   Clinical Narrative:    Patient will DC to: home Anticipated DC date:2/29/2023 Family notified: yes, niece Transport by: car  Admitted s/p fall. Suffered closed right hip fracture. PTA independent with ADL's, no DME.      - 2/24, s/p cephalomedullary nailing  Per MD patient ready for DC tomorrow. RN, patient,and patient's family notified of DC plan. Pt will transition home with niece.Orders noted for home health services . Pt agreeable. Pt without insurance. Referral made with [facility] for charity care, acceptance pending. Post hospital  f/u noted on AVS.   TOC pharmacy will deliver Rx meds to bedside prior to d/c.Marland KitchenMarland KitchenMarland KitchenMatch Letter given to assist with med cost. Yee Joss (Niece)       [phone]      Per niece Okey Regal family will provide transportation to home on tomorrow.  RNCM will sign off for now as intervention is no longer needed. Please consult [place] again if new needs arise.   02/12/2022 [address] accepted pt for home health PT services under charity care.  Final next level of care: Home w Home Health Services Barriers to Discharge: No Barriers Identified   Patient Goals and CMS Choice     Choice offered to / list presented to : Patient  Discharge Placement                       Discharge Plan and Services   Discharge Planning Services: CM Consult            DME Arranged: Dan Humphreys rolling, 3-N-1 DME Agency: [employer] Date DME Agency Contacted: 02/11/22 Time DME Agency Contacted: 1328 Representative spoke with at DME Agency: Velna Hatchet HH Arranged: PT HH Agency: [facility] Date [facility] Agency Contacted: 02/28/23Time Home Health Agency contacted:[phone] Representative spoke with at home  health agency: Stacie ( voicemessage  Social Determinants of Health (SDOH) Interventions     Readmission Risk Interventions No flowsheet data found.

## 2022-02-12 ENCOUNTER — Other Ambulatory Visit (HOSPITAL_COMMUNITY): Payer: Self-pay

## 2022-02-12 MED ORDER — APIXABAN 2.5 MG PO TABS
ORAL_TABLET | ORAL | 0 refills | Status: AC
  Filled 2022-02-12 – 2022-02-25 (×2): qty 60, 30d supply, fill #0

## 2022-02-12 MED ORDER — ERGOCALCIFEROL 1.25 MG (50000 UT) PO CAPS
ORAL_CAPSULE | ORAL | 0 refills | Status: AC
  Filled 2022-02-12: qty 8, 14d supply, fill #0
  Filled 2022-04-29 – 2022-05-08 (×2): qty 4, 28d supply, fill #0

## 2022-02-12 NOTE — Discharge Summary (Signed)
Physician Discharge Summary  CRANSTON ATILES [id number] DOB: 07/05/1961 DOA: 02/07/2022  PCP: Derrick Stain, MD  Admit date: 02/07/2022 Discharge date: 02/12/2022  Admitted From: Home Discharge disposition: Home with home health  Recommendations at discharge:  Stop alcohol use   Brief narrative: Derrick Andrade is a 61 y.o. male with PMH significant for chronic alcoholism, chronic smoking, hypertension. Patient was brought to the ED on 2/24 by EMS after falling while intoxicated.  He started having right hip and right knee pain. X-ray showed right hip fracture. Orthopedics consulted Admitted to hospitalist service Patient underwent cephalomedullary nailing of right hip  Subjective: Patient was seen and examined this morning.  Sitting up in bed.  Not in distress.  Not on oxygen.  Ready to go home.  Principal Problem:   Closed right hip fracture (HCC) Active Problems:   Primary hypertension   Tobacco use   Alcohol dependence ([place])    Assessment and Plan: Closed right hip fracture   -Secondary to a fall while intoxicated.   -2/24, underwent cephalomedullary nailing. -Pain meds and DVT prophylaxis per orthopedics. -Currently on Robaxin, Tylenol, oxycodone and morphine as needed -At discharge, Per orthopedics, patient will continue Percocet 5-325 mg q 6 hours PRN for pain control -Eliquis 2.5 mg twice daily x30 days for DVT prophylaxis -Patient will follow-up with orthopedics as an outpatient for 2 weeks for repeat x-rays and wound check.  Vitamin D deficiency -Significantly low vitamin D level at 8. -Continue Vit D2 supplementation 50,000 IU q. 7 days x 8 weeks   Chronic alcohol dependence   At risk of withdrawal -Patient lives with his niece.  Reportedly drinks number of beers a day. -Blood alcohol level on admission was elevated to 258 -Currently on CIWA protocol with as needed IV Ativan.  He did not have withdrawal symptoms in the hospital.  Tobacco use   -Encourage cessation.   -Patch ordered  Primary hypertension  -Continue Norvasc, Hyzaar  Impaired mobility -PT eval obtained.  SNF was recommended initially..  Mobility improved.  Home with PT recommended by PT on 2/27.   Mobility: PT eval obtained Goals of care   Code Status: Full Code    Nutritional status:  Body mass index is 22.07 kg/m.  Nutrition Problem: Increased nutrient needs Etiology: post-op healing, hip fracture Signs/Symptoms: estimated needs    Wounds:  - Incision (Closed) 03/14/15 Eye Right (Active)  Date First Assessed/Time First Assessed: 03/14/15 2145   Location: Eye  Location Orientation: Right    Assessments 03/14/2015  9:40 PM 03/14/2015 10:15 PM  Dressing Type Gauze (Comment);Eye shield Gauze (Comment);Eye shield  Dressing Clean;Dry;Intact Clean;Dry;Intact  Drainage Amount None None     No Linked orders to display     Incision (Closed) 02/07/22 Hip Right (Active)  Date First Assessed/Time First Assessed: 02/07/22 1453   Location: Hip  Location Orientation: Right    Assessments 02/07/2022  3:25 PM 02/11/2022  8:42 PM  Dressing Type Hydrocolloid None  Dressing Clean, Dry, Intact Clean, Dry, Intact  Site / Wound Assessment Dressing in place / Unable to assess Clean;Dry  Closure -- Skin glue  Drainage Amount None --     No Linked orders to display     Incision (Closed) 02/07/22 Hip Anterior;Right (Active)  Date First Assessed/Time First Assessed: 02/07/22 1652   Location: Hip  Location Orientation: Anterior;Right  Present on Admission: Yes    Assessments 02/07/2022  4:38 PM 02/11/2022  8:42 PM  Dressing Type Hydrocolloid None  Dressing Clean,  Dry, Intact Clean, Dry, Intact  Site / Wound Assessment -- Clean;Dry  Margins -- Attached edges (approximated)  Closure -- Skin glue  Drainage Amount None None     No Linked orders to display    Discharge Exam:   Vitals:   02/11/22 1506 02/11/22 1548 02/11/22 2042 02/12/22 0832  BP: (!) 131/108  126/62 135/69 123/74  Pulse: 82 75 77 67  Resp: 18 18 20    Temp: 98 F (36.7 C) 98.3 F (36.8 C) 98.1 F (36.7 C) 98.2 F (36.8 C)  TempSrc:  Oral Oral Oral  SpO2: 100% 95% 100% 97%  Weight:      Height:        Body mass index is 22.07 kg/m.   General exam: [name].  Not in physical distress Skin: No rashes, lesions or ulcers. HEENT: Atraumatic, normocephalic, no obvious bleeding Lungs: Clear to auscultation bilaterally CVS: Regular rate and rhythm, no murmur GI/Abd soft, nontender, nondistended, bowel sounds CNS: Alert, awake, oriented x3.  Not in alcohol withdrawal Psychiatry: Mood appropriate Extremities: No pedal edema, no calf tenderness  Follow ups:    Follow-up Information     Derrick Stain, MD Follow up.   Specialty: Pulmonary Disease Contact information: [address] [place] [address] [phone]         Dr. Lennette Bihari Andrade Follow up on 02/25/2022.   Why: post hospital follow up scheduled with Dr.Haddix ( ortho MD) for 02/25/2022 at 2:45 pm Contact information: Address: [address], [place], [place] [address] Phone: [phone]                Discharge Instructions:   Discharge Instructions     Call MD for:  difficulty breathing, headache or visual disturbances   Complete by: As directed    Call MD for:  extreme fatigue   Complete by: As directed    Call MD for:  hives   Complete by: As directed    Call MD for:  persistant dizziness or light-headedness   Complete by: As directed    Call MD for:  persistant nausea and vomiting   Complete by: As directed    Call MD for:  severe uncontrolled pain   Complete by: As directed    Call MD for:  temperature >100.4   Complete by: As directed    Diet general   Complete by: As directed    Discharge instructions   Complete by: As directed    General discharge instructions: Follow with Primary MD Derrick Stain, MD in 7 days  Please request  your PCP  to go over your hospital tests, procedures, radiology results at the follow up. Please get your medicines reviewed and adjusted.  Your PCP may decide to repeat certain labs or tests as needed. Do not drive, operate heavy machinery, perform activities at heights, swimming or participation in water activities or provide baby sitting services if your were admitted for syncope or siezures until you have seen by Primary MD or a Neurologist and advised to do so again. [place] Controlled Substance Reporting System database was reviewed. Do not drive, operate heavy machinery, perform activities at heights, swim, participate in water activities or provide baby-sitting services while on medications for pain, sleep and mood until your outpatient physician has reevaluated you and advised to do so again.  You are strongly recommended to comply with the dose, frequency and duration of prescribed medications. Activity: As tolerated with Full fall precautions use  walker/cane & assistance as needed Avoid using any recreational substances like cigarette, tobacco, alcohol, or non-prescribed drug. If you experience worsening of your admission symptoms, develop shortness of breath, life threatening emergency, suicidal or homicidal thoughts you must seek medical attention immediately by calling 911 or calling your MD immediately  if symptoms less severe. You must read complete instructions/literature along with all the possible adverse reactions/side effects for all the medicines you take and that have been prescribed to you. Take any new medicine only after you have completely understood and accepted all the possible adverse reactions/side effects.  Wear Seat belts while driving. You were cared for by a hospitalist during your hospital stay. If you have any questions about your discharge medications or the care you received while you were in the hospital after you are discharged, you can call the unit and ask to  speak with the hospitalist or the covering physician. Once you are discharged, your primary care physician will handle any further medical issues. Please note that NO REFILLS for any discharge medications will be authorized once you are discharged, as it is imperative that you return to your primary care physician (or establish a relationship with a primary care physician if you do not have one).   Discharge wound care:   Complete by: As directed    Increase activity slowly   Complete by: As directed        Discharge Medications:   Allergies as of 02/12/2022   No Known Allergies      Medication List     STOP taking these medications    losartan-hydrochlorothiazide 100-12.5 MG tablet Commonly known as: HYZAAR       TAKE these medications    acetaminophen 325 MG tablet Commonly known as: TYLENOL Take 2 tablets (650 mg total) by mouth every 6 (six) hours as needed for mild pain (or temp > 100).   amLODipine 10 MG tablet Commonly known as: NORVASC Take 1 tablet (10 mg total) by mouth daily.   apixaban 2.5 MG Tabs tablet Commonly known as: Eliquis Take 1 tablet (2.5 mg total) by mouth 2 (two) times daily.   buPROPion 150 MG 12 hr tablet Commonly known as: WELLBUTRIN SR Take 1 tablet (150 mg total) by mouth daily.   docusate sodium 100 MG capsule Commonly known as: COLACE Take 1 capsule (100 mg total) by mouth 2 (two) times daily.   hydrochlorothiazide 12.5 MG tablet Commonly known as: HYDRODIURIL Take 1 tablet (12.5 mg total) by mouth daily.   losartan 100 MG tablet Commonly known as: COZAAR Take 1 tablet (100 mg total) by mouth daily.   methocarbamol 500 MG tablet Commonly known as: ROBAXIN Take 1 tablet (500 mg total) by mouth every 6 (six) hours as needed for up to 7 days for muscle spasms. What changed:  when to take this reasons to take this   oxyCODONE-acetaminophen 5-325 MG tablet Commonly known as: Percocet Take 1 tablet by mouth every 6 (six) hours  as needed for up to 5 days for severe pain.   Vitamin D (Ergocalciferol) 1.25 MG (50000 UNIT) Caps capsule Commonly known as: DRISDOL Take 1 capsule (50,000 Units total) by mouth every 7 (seven) days for 7 days. Start taking on: February 15, 2022               Durable Medical Equipment  (From admission, onward)           Start     Ordered   02/11/22 [phone]  For home use only DME Walker rolling  Once       Question Answer Comment  Walker: With [place]   Patient needs a walker to treat with the following condition Impaired mobility      02/11/22 0815   02/11/22 0815  For home use only DME 3 n 1  Once        02/11/22 0815              Discharge Care Instructions  (From admission, onward)           Start     Ordered   02/11/22 0000  Discharge wound care:        02/11/22 1735             The results of significant diagnostics from this hospitalization (including imaging, microbiology, ancillary and laboratory) are listed below for reference.    Procedures and Diagnostic Studies:   DG Chest Port 1 View  Result Date: 02/07/2022 CLINICAL DATA:  Preop EXAM: PORTABLE CHEST 1 VIEW COMPARISON:  09/06/2019 FINDINGS: Normal heart size and mediastinal contours. Mild streaky density at the bases. No acute infiltrate or edema. No effusion or pneumothorax. No acute osseous findings. IMPRESSION: Mild streaky density at the bases usually from atelectasis. Electronically Signed   By: Jorje Guild M.D.   On: 02/07/2022 06:39   DG Knee Right Port  Result Date: 02/07/2022 CLINICAL DATA:  Fall right knee pain EXAM: PORTABLE RIGHT KNEE - 1-2 VIEW COMPARISON:  None. FINDINGS: There is no evidence of acute fracture or dislocation. Deformity of the proximal fibula is suggestive of prior fracture. Alignment is normal. The joint spaces are preserved. The soft tissues are unremarkable. There is no effusion. IMPRESSION: 1. No acute fracture or dislocation. 2.  Remote fracture of  the proximal right fibula. Electronically Signed   By: Valetta Mole M.D.   On: 02/07/2022 09:54   DG C-Arm 1-60 Min-No Report  Result Date: 02/07/2022 Fluoroscopy was utilized by the requesting physician.  No radiographic interpretation.   DG HIP PORT UNILAT W OR W/O PELVIS 1V RIGHT  Result Date: 02/07/2022 CLINICAL DATA:  Postop EXAM: DG HIP (WITH OR WITHOUT PELVIS) 1V PORT RIGHT COMPARISON:  02/07/2022 FINDINGS: Pubic symphysis is intact. Interval intramedullary rod and distal screw fixation of the right femur of intertrochanteric fracture. Anatomic alignment. Gas in the soft tissues consistent with recent surgery IMPRESSION: Interval surgical fixation of right intertrochanteric fracture with expected postsurgical changes. Electronically Signed   By: Donavan Foil M.D.   On: 02/07/2022 16:30   DG Hip Unilat W or Wo Pelvis 2-3 Views Right  Result Date: 02/07/2022 CLINICAL DATA:  Right hip pain EXAM: DG HIP (WITH OR WITHOUT PELVIS) 2-3V RIGHT COMPARISON:  None. FINDINGS: A intratrochanteric fracture of the right hip is present, likely acute in nature with a lucency seen within the greater trochanter and acute avulsion of the lesser trochanter noted. There is varus angulation of the right femoral neck, new since prior CT examination of 09/06/2019. No dislocation. Hip joint spaces are preserved. Limited evaluation of the left hip is unremarkable. Soft tissues are unremarkable. IMPRESSION: Suspected acute intratrochanteric fracture of the right hip with avulsion of the lesser trochanter and resultant varus angulation. This could be confirmed with CT imaging. Electronically Signed   By: Fidela Salisbury M.D.   On: 02/07/2022 04:11   DG FEMUR, MIN 2 VIEWS RIGHT  Result Date: 02/07/2022 CLINICAL DATA:  Intramedullary nail surgery EXAM: RIGHT FEMUR 2  VIEWS COMPARISON:  Right hip radiographs 02/07/2022 FINDINGS: Images were performed intraoperatively without the presence of a radiologist. Patient appears to be  undergoing cephalomedullary nail fixation of the previously seen proximal right femoral intertrochanteric fracture. Please see intraoperative findings for further detail. IMPRESSION: Proximal right femoral ORIF. Electronically Signed   By: Yvonne Kendall M.D.   On: 02/07/2022 15:09     Labs:   Basic Metabolic Panel: Recent Labs  Lab 02/07/22 0430 02/08/22 0125 02/09/22 0107 02/10/22 0312  NA 140 136 133* 133*  K 3.1* 3.7 3.5 3.5  CL 106 102 99 97*  CO2 22 26 27 28   GLUCOSE 109* 146* 128* 111*  BUN 6 8 10 12   CREATININE 0.86 0.95 1.23 1.01  CALCIUM 8.4* 8.4* 8.2* 8.6*   GFR Estimated Creatinine Clearance: 82.2 mL/min (by C-G formula based on SCr of 1.01 mg/dL). Liver Function Tests: No results for input(s): AST, ALT, ALKPHOS, BILITOT, PROT, ALBUMIN in the last 168 hours. No results for input(s): LIPASE, AMYLASE in the last 168 hours. No results for input(s): AMMONIA in the last 168 hours. Coagulation profile No results for input(s): INR, PROTIME in the last 168 hours.  CBC: Recent Labs  Lab 02/07/22 0430 02/08/22 0125 02/09/22 0107  WBC 8.6 15.8* 11.4*  NEUTROABS 6.7  --   --   HGB 10.1* 9.4* 8.4*  HCT 31.7* 28.5* 25.7*  MCV 80.9 79.4* 80.1  PLT 318 312 295   Cardiac Enzymes: No results for input(s): CKTOTAL, CKMB, CKMBINDEX, TROPONINI in the last 168 hours. BNP: Invalid input(s): POCBNP CBG: Recent Labs  Lab 02/11/22 2011  GLUCAP 161*   D-Dimer No results for input(s): DDIMER in the last 72 hours. Hgb A1c No results for input(s): HGBA1C in the last 72 hours. Lipid Profile No results for input(s): CHOL, HDL, LDLCALC, TRIG, CHOLHDL, LDLDIRECT in the last 72 hours. Thyroid function studies No results for input(s): TSH, T4TOTAL, T3FREE, THYROIDAB in the last 72 hours.  Invalid input(s): FREET3 Anemia work up No results for input(s): VITAMINB12, FOLATE, FERRITIN, TIBC, IRON, RETICCTPCT in the last 72 hours. Microbiology Recent Results (from the past 240  hour(s))  Resp Panel by RT-PCR (Flu A&B, Covid) Nasopharyngeal Swab     Status: None   Collection Time: 02/07/22  4:32 AM   Specimen: Nasopharyngeal Swab; Nasopharyngeal(NP) swabs in vial transport medium  Result Value Ref Range Status   SARS Coronavirus 2 by RT PCR NEGATIVE NEGATIVE Final    Comment: (NOTE) SARS-CoV-2 target nucleic acids are NOT DETECTED.  The SARS-CoV-2 RNA is generally detectable in upper respiratory specimens during the acute phase of infection. The lowest concentration of SARS-CoV-2 viral copies this assay can detect is 138 copies/mL. A negative result does not preclude SARS-Cov-2 infection and should not be used as the sole basis for treatment or other patient management decisions. A negative result may occur with  improper specimen collection/handling, submission of specimen other than nasopharyngeal swab, presence of viral mutation(s) within the areas targeted by this assay, and inadequate number of viral copies(<138 copies/mL). A negative result must be combined with clinical observations, patient history, and epidemiological information. The expected result is Negative.  Fact Sheet for Patients:  EntrepreneurPulse.com.au  Fact Sheet for Healthcare Providers:  IncredibleEmployment.be  This test is no t yet approved or cleared by the [place] FDA and  has been authorized for detection and/or diagnosis of SARS-CoV-2 by FDA under an Emergency Use Authorization (EUA). This EUA will remain  in effect (meaning this test can  be used) for the duration of the COVID-19 declaration under Section 564(b)(1) of the Act, 21 U.S.C.section 360bbb-3(b)(1), unless the authorization is terminated  or revoked sooner.       Influenza A by PCR NEGATIVE NEGATIVE Final   Influenza B by PCR NEGATIVE NEGATIVE Final    Comment: (NOTE) The Xpert Xpress SARS-CoV-2/FLU/RSV plus assay is intended as an aid in the diagnosis of influenza from  Nasopharyngeal swab specimens and should not be used as a sole basis for treatment. Nasal washings and aspirates are unacceptable for Xpert Xpress SARS-CoV-2/FLU/RSV testing.  Fact Sheet for Patients: EntrepreneurPulse.com.au  Fact Sheet for Healthcare Providers: IncredibleEmployment.be  This test is not yet approved or cleared by the [place] FDA and has been authorized for detection and/or diagnosis of SARS-CoV-2 by FDA under an Emergency Use Authorization (EUA). This EUA will remain in effect (meaning this test can be used) for the duration of the COVID-19 declaration under Section 564(b)(1) of the Act, 21 U.S.C. section 360bbb-3(b)(1), unless the authorization is terminated or revoked.  Performed at [facility], [place] [address], [place], [place] [address]   Surgical pcr screen     Status: None   Collection Time: 02/07/22  1:26 PM   Specimen: Nasal Mucosa; Nasal Swab  Result Value Ref Range Status   MRSA, PCR NEGATIVE NEGATIVE Final   Staphylococcus aureus NEGATIVE NEGATIVE Final    Comment: (NOTE) The Xpert SA Assay (FDA approved for NASAL specimens in patients 73 years of age and older), is one component of a comprehensive surveillance program. It is not intended to diagnose infection nor to guide or monitor treatment. Performed at [facility], [place] [address] [place], [place] [address]     Time coordinating discharge: 35 minutes  Signed: Hildagard Sobecki  [facility] 02/12/2022, 10:13 AM

## 2022-02-13 ENCOUNTER — Telehealth: Payer: Self-pay

## 2022-02-13 ENCOUNTER — Other Ambulatory Visit (HOSPITAL_COMMUNITY): Payer: Self-pay

## 2022-02-13 NOTE — Telephone Encounter (Signed)
Transition Care Management Unsuccessful Follow-up Telephone Call ? ?Date of discharge and from where:  02/12/2022, [facility] ? ?Attempts:  1st Attempt ? ?Reason for unsuccessful TCM follow-up call:  Unable to reach patient  ?# [phone], the phone just rings, no voicemail option. ?# [phone], Derrick Andrade, who stated he is the patient's contact, said that the patient is with his niece. ?Called his niece, Derrick Andrade # [phone] and the phone just rings, no voicemail option. ?# [phone], Derrick Andrade answered and said she would give him a message to call this CM back. She has the phone number for [facility].  ? ?Need to schedule follow up appointment with Dr Delford Field.  ? ? ? ?

## 2022-02-13 NOTE — Telephone Encounter (Signed)
Transition Care Management Follow-up Telephone Call ? ?Patient returned call to this CM. ?Date of discharge and from where: 02/12/2022, [facility]  ?How have you been since you were released from the hospital? He said he never should have been sent home but he could not explain anything about his physical health. He stated that his mailbox is full of bills from [facility], he has no money. He wanted to go to rehab and they told him no. He said he just wants the doctor to pay his bills and get him disability.  I tried to explain that we are not able to pay his bills but we can refer him for assistance with applying for disability. He then continued to speak  about having no money and what will the doctor for him??  He would not give me the chance to explain how the PCP and this clinic can help him.  ?Any questions or concerns? Yes - noted above.  ? ?Items Reviewed: ?Did the pt receive and understand the discharge instructions provided?  He said that he has them. ?Medications obtained and verified?  He said he has all medications but the blood thinner, noting it was too expensive at this pharmacy.  I tried to explain that we could have it filled at [facility] and there are patient assistance programs to help with the cost but he again, changed subjects.  Referred to his bills, his surgery and feeling that he should not have been sent home so soon.  ?Other? No  ?Any new allergies since your discharge? No  ?Dietary orders reviewed? No ?Do you have support at home? Yes - he stays with his niece. ? ?Home Care and Equipment/Supplies: ?Were home health services ordered? yes ?If so, what is the name of the agency? [facility]  ?Has the agency set up a time to come to the patient's home? No. I tried to update his phone numbers in Epic, I explained that I called multiple numbers and the main number for him just rings. The home health agency will need to be able to reach him.  He said he has no phone and the only  number to reach him is through his niece, Okey Regal.  Epic then updated.  ?Were any new equipment or medical supplies ordered?  No ?What is the name of the medical supply agency? N/a ?Were you able to get the supplies/equipment? not applicable ?Do you have any questions related to the use of the equipment or supplies? No ? ?He said he already has a walker and BSC.  ? ?Functional Questionnaire: (I = Independent and D = Dependent) ?ADLs: He would not answer this question, He kept speaking about having no money, the doctor " cutting" him, not going to inpatient rehab when he was discharged.  ? ? ?Follow up appointments reviewed: ? ?PCP Hospital f/u appt confirmed?  He would not commit to scheduling an appointment.  I explained that we can arrange transportation and he would change the subject, talking in tangents.   He asked, what is this doctor going to do? Then he spoke about the surgery again and not having money, having a mailbox full of bills. I explained that I would need to call him back next week and we can discuss scheduling an appointment.  ?Specialist Hospital f/u appt confirmed?  He said he knows he needs to call Dr Jena Gauss and he has the phone number.    ?Are transportation arrangements needed? Yes  - he said  he has no transportation. I tried to explain that we can arrange a ride for him but again , he would not commit to this and changed the subject.  ?If their condition worsens, is the pt aware to call PCP or go to the Emergency Dept.? Yes ?Was the patient provided with contact information for the PCP's office or ED? Yes ?Was to pt encouraged to call back with questions or concerns? Yes ? ?

## 2022-02-13 NOTE — Telephone Encounter (Signed)
Dalene Carrow, CMA spoke to him and scheduled him with Dr Delford Field  - 03/03/2022.  ?

## 2022-02-18 ENCOUNTER — Other Ambulatory Visit (HOSPITAL_COMMUNITY): Payer: Self-pay

## 2022-02-25 ENCOUNTER — Other Ambulatory Visit: Payer: Self-pay

## 2022-02-26 ENCOUNTER — Other Ambulatory Visit: Payer: Self-pay

## 2022-03-02 NOTE — Progress Notes (Incomplete)
? ?Established Patient Office Visit ? ?Subjective:  ?Patient ID: Derrick Andrade, male    DOB: Jun 11, 1961  Age: 61 y.o. MRN: [id number] ? ?CC: No chief complaint on file. ? ? ?HPI ?Derrick Andrade presents for  ?Chronic bilateral low back pain without sciatica ?Bilateral back pain we will refill the Tylenol 3 and Robaxin for now I have encouraged the patient to get the lumbar spine series as it request ?  ?Periodontal disease ?Again have recommended and furnish dental resources for this patient to obtain a dental exam ?  ?Rhonchi at both lung bases ?Still has evidence of rhonchi and adventitious sounds on the lungs I requested a chest x-ray for him he is yet to obtain he said he will follow through on this ?  ?Tobacco use ?  ? Admit date: 02/07/2022 ?Discharge date: 02/12/2022 ?  ?Admitted From: Home ?Discharge disposition: Home with home health ?  ?Recommendations at discharge:  ?Stop alcohol use ?  ?  ?Brief narrative: ?Derrick Andrade is a 61 y.o. male with PMH significant for chronic alcoholism, chronic smoking, hypertension. ?Patient was brought to the ED on 2/24 by EMS after falling while intoxicated.  He started having right hip and right knee pain. ?X-ray showed right hip fracture. ?Orthopedics consulted ?Admitted to hospitalist service ?Patient underwent cephalomedullary nailing of right hip ?  ?Subjective: ?Patient was seen and examined this morning.  Sitting up in bed.  Not in distress.  Not on oxygen.  Ready to go home. ?  ?Principal Problem: ?  Closed right hip fracture (HCC) ?Active Problems: ?  Primary hypertension ?  Tobacco use ?  Alcohol dependence (HCC) ?  ?  ?Assessment and Plan: ?Closed right hip fracture   ?-Secondary to a fall while intoxicated.   ?-2/24, underwent cephalomedullary nailing. ?-Pain meds and DVT prophylaxis per orthopedics. ?-Currently on Robaxin, Tylenol, oxycodone and morphine as needed ?-At discharge, Per orthopedics, patient will continue Percocet 5-325 mg q 6 hours PRN for pain  control ?-Eliquis 2.5 mg twice daily x30 days for DVT prophylaxis ?-Patient will follow-up with orthopedics as an outpatient for 2 weeks for repeat x-rays and wound check. ?  ?Vitamin D deficiency ?-Significantly low vitamin D level at 8. ?-Continue Vit D2 supplementation 50,000 IU q. 7 days x 8 weeks ?  ?Chronic alcohol dependence   ?At risk of withdrawal ?-Patient lives with his niece.  Reportedly drinks number of beers a day. ?-Blood alcohol level on admission was elevated to 258 ?-Currently on CIWA protocol with as needed IV Ativan.  He did not have withdrawal symptoms in the hospital. ?  ?Tobacco use  ?-Encourage cessation.   ?-Patch ordered ?  ?Primary hypertension  ?-Continue Norvasc, Hyzaar ?  ?Impaired mobility ?-PT eval obtained.  SNF was recommended initially..  Mobility improved.  Home with PT recommended by PT on 2/27.  ? ?Past Medical History:  ?Diagnosis Date  ?? Alcohol dependence (HCC)   ?? Hypertension   ?? Perirectal abscess 09/06/2019  ?? Tobacco dependence   ?? Wrist fracture   ? left  ? ? ?Past Surgical History:  ?Procedure Laterality Date  ?? INCISE AND DRAIN ABCESS    ? "boil" on buttock  ?? INCISION AND DRAINAGE PERIRECTAL ABSCESS N/A 09/06/2019  ? Procedure: IRRIGATION AND DEBRIDEMENT PERIRECTAL ABSCESS;  Surgeon: Berna Bue, MD;  Location: [facility] OR;  Service: General;  Laterality: N/A;  ?? INTRAMEDULLARY (IM) NAIL INTERTROCHANTERIC Right 02/07/2022  ? Procedure: INTRAMEDULLARY (IM) NAIL INTERTROCHANTRIC;  Surgeon: Truitt Merle  P, MD;  Location: [facility] OR;  Service: Orthopedics;  Laterality: Right;  ?? PARS PLANA VITRECTOMY Right 03/14/2015  ? Procedure: Exploration Right Ruptured Globe, Vitrous Tap, Antibiotic Injection, PARS PLANA VITRECTOMY 25 GAUGE WITH LASER;  Surgeon: Carmela RimaNarendra Patel, MD;  [facility];  Service: Ophthalmology;  Laterality: Right;  ? ? ?Family History  ?Family history unknown: Yes  ? ? ?Social History  ? ?Socioeconomic History  ?? Marital status: Single  ?  Spouse  name: Not on file  ?? Number of children: Not on file  ?? Years of education: Not on file  ?? Highest education level: Not on file  ?Occupational History  ?? Not on file  ?Tobacco Use  ?? Smoking status: Every Day  ?  Packs/day: 0.50  ?  Types: Cigarettes  ?? Smokeless tobacco: Never  ?? Tobacco comments:  ?  5-6 cigarettes daily  ?Substance and Sexual Activity  ?? Alcohol use: Yes  ?  Alcohol/week: 1.0 - 2.0 standard drink  ?  Types: 1 - 2 Cans of beer per week  ?  Comment: 1-2 beers daily  ?? Drug use: No  ?? Sexual activity: Not on file  ?Other Topics Concern  ?? Not on file  ?Social History Narrative  ?? Not on file  ? ?Social Determinants of Health  ? ?Financial Resource Strain: Not on file  ?Food Insecurity: Not on file  ?Transportation Needs: Not on file  ?Physical Activity: Not on file  ?Stress: Not on file  ?Social Connections: Not on file  ?Intimate Partner Violence: Not on file  ? ? ?Outpatient Medications Prior to Visit  ?Medication Sig Dispense Refill  ?? acetaminophen (TYLENOL) 325 MG tablet Take 2 tablets (650 mg total) by mouth every 6 (six) hours as needed for mild pain (or temp > 100).    ?? amLODipine (NORVASC) 10 MG tablet Take 1 tablet (10 mg total) by mouth daily. 30 tablet 0  ?? apixaban (ELIQUIS) 2.5 MG TABS tablet Take 1 tablet (2.5 mg total) by mouth 2 (two) times daily. 60 tablet 0  ?? apixaban (ELIQUIS) 2.5 MG TABS tablet Take 1 tablet (2.5 mg total) by mouth 2 (two) times daily. 60 tablet 0  ?? buPROPion (WELLBUTRIN SR) 150 MG 12 hr tablet Take 1 tablet (150 mg total) by mouth daily. 30 tablet 0  ?? docusate sodium (COLACE) 100 MG capsule Take 1 capsule (100 mg total) by mouth 2 (two) times daily. 60 capsule 0  ?? ergocalciferol (VITAMIN D2) 1.25 MG (50000 UT) capsule Take 1 capsule (50,000 units total) by mouth every 7 (seven) days. 8 capsule 0  ?? hydrochlorothiazide (HYDRODIURIL) 12.5 MG tablet Take 1 tablet (12.5 mg total) by mouth daily. 30 tablet 0  ?? losartan (COZAAR) 100 MG  tablet Take 1 tablet (100 mg total) by mouth daily. 30 tablet 0  ? ?No facility-administered medications prior to visit.  ? ? ?No Known Allergies ? ?ROS ?Review of Systems ? ?  ?Objective:  ?  ?Physical Exam ? ?There were no vitals taken for this visit. ?Wt Readings from Last 3 Encounters:  ?02/07/22 162 lb 11.2 oz (73.8 kg)  ?02/04/21 162 lb 12.8 oz (73.8 kg)  ?12/03/20 160 lb (72.6 kg)  ? ? ? ?Health Maintenance Due  ?Topic Date Due  ?? COLON CANCER SCREENING ANNUAL FOBT  Never done  ?? Zoster Vaccines- Shingrix (1 of 2) Never done  ?? COVID-19 Vaccine (4 - Booster for Pfizer series) 01/30/2021  ?? INFLUENZA VACCINE  07/15/2021  ? ? ?  There are no preventive care reminders to display for this patient. ? ?No results found for: TSH ?Lab Results  ?Component Value Date  ? WBC 11.4 (H) 02/09/2022  ? HGB 8.4 (L) 02/09/2022  ? HCT 25.7 (L) 02/09/2022  ? MCV 80.1 02/09/2022  ? PLT 295 02/09/2022  ? ?Lab Results  ?Component Value Date  ? NA 133 (L) 02/10/2022  ? K 3.5 02/10/2022  ? CO2 28 02/10/2022  ? GLUCOSE 111 (H) 02/10/2022  ? BUN 12 02/10/2022  ? CREATININE 1.01 02/10/2022  ? BILITOT 0.7 11/05/2020  ? ALKPHOS 84 11/05/2020  ? AST 21 11/05/2020  ? ALT 12 11/05/2020  ? PROT 7.6 11/05/2020  ? ALBUMIN 4.1 11/05/2020  ? CALCIUM 8.6 (L) 02/10/2022  ? ANIONGAP 8 02/10/2022  ? ?No results found for: CHOL ?No results found for: HDL ?No results found for: LDLCALC ?No results found for: TRIG ?No results found for: CHOLHDL ?Lab Results  ?Component Value Date  ? HGBA1C 5.0 11/05/2020  ? ? ?  ?Assessment & Plan:  ? ?Problem List Items Addressed This Visit   ?None ? ? ?No orders of the defined types were placed in this encounter. ? ? ?Follow-up: No follow-ups on file.  ? ? ?Shan Levans, MD ?

## 2022-03-03 ENCOUNTER — Encounter: Payer: Self-pay | Admitting: Critical Care Medicine

## 2022-03-03 ENCOUNTER — Ambulatory Visit (HOSPITAL_BASED_OUTPATIENT_CLINIC_OR_DEPARTMENT_OTHER): Payer: Self-pay | Admitting: Critical Care Medicine

## 2022-03-03 DIAGNOSIS — Z91199 Patient's noncompliance with other medical treatment and regimen due to unspecified reason: Secondary | ICD-10-CM

## 2022-03-03 NOTE — Progress Notes (Signed)
The patient was no show for this appointment. ? ? ?

## 2022-04-29 ENCOUNTER — Other Ambulatory Visit: Payer: Self-pay

## 2022-04-29 ENCOUNTER — Other Ambulatory Visit: Payer: Self-pay | Admitting: Critical Care Medicine

## 2022-05-01 ENCOUNTER — Other Ambulatory Visit: Payer: Self-pay

## 2022-05-06 ENCOUNTER — Other Ambulatory Visit: Payer: Self-pay

## 2022-05-08 ENCOUNTER — Other Ambulatory Visit: Payer: Self-pay

## 2022-05-09 ENCOUNTER — Other Ambulatory Visit: Payer: Self-pay

## 2022-05-13 ENCOUNTER — Other Ambulatory Visit: Payer: Self-pay

## 2022-07-21 ENCOUNTER — Ambulatory Visit: Payer: Self-pay

## 2022-07-21 NOTE — Telephone Encounter (Signed)
Left message on voicemail to return call.  Attempt to schedule apt to be evaluated for vision.

## 2022-07-21 NOTE — Telephone Encounter (Signed)
      Chief Complaint: Blurred vision left eye x 2 weeks. Cataract surgery 1 year ago. Asking to be worked in today. Symptoms: Blurred vision comes and goes Frequency: 2 weeks ago Pertinent Negatives: Patient denies headache Disposition: [] ED /[] Urgent Care (no appt availability in office) / [] Appointment(In office/virtual)/ []  [facility]/ [] Home Care/ [] Refused Recommended Disposition /[] [place]/ []  Follow-up with PCP Additional Notes: Please advise pt.  Answer Assessment - Initial Assessment Questions 1. DESCRIPTION: "How has your vision changed?" (e.g., complete vision loss, blurred vision, double vision, floaters, etc.)     Blurred vision 2. LOCATION: "One or both eyes?" If one, ask: "Which eye?"     Left eye 3. SEVERITY: "Can you see anything?" If Yes, ask: "What can you see?" (e.g., fine print)     Yes 4. ONSET: "When did this begin?" "Did it start suddenly or has this been gradual?"     2 weeks ago 5. PATTERN: "Does this come and go, or has it been constant since it started?"     Comes and goes 6. PAIN: "Is there any pain in your eye(s)?"  (Scale 1-10; or mild, moderate, severe)   - NONE (0): No pain.   - MILD (1-3): Doesn't interfere with normal activities.   - MODERATE (4-7): Interferes with normal activities or awakens from sleep.    - SEVERE (8-10): Excruciating pain, unable to do any normal activities.     No 7. CONTACTS-GLASSES: "Do you wear contacts or glasses?"     Yes 8. CAUSE: "What do you think is causing this visual problem?"     Unsure 9. OTHER SYMPTOMS: "Do you have any other symptoms?" (e.g., confusion, headache, arm or leg weakness, speech problems)     No 10. PREGNANCY: "Is there any chance you are pregnant?" "When was your last menstrual period?"       N/a  Protocols used: Vision Loss or Change-A-AH

## 2022-08-06 ENCOUNTER — Other Ambulatory Visit: Payer: Self-pay

## 2022-08-06 ENCOUNTER — Emergency Department (HOSPITAL_COMMUNITY)
Admission: EM | Admit: 2022-08-06 | Discharge: 2022-08-06 | Disposition: A | Discharge status: Home / Self Care | Payer: Commercial Managed Care - HMO | Attending: Emergency Medicine | Admitting: Emergency Medicine

## 2022-08-06 ENCOUNTER — Encounter (HOSPITAL_COMMUNITY): Payer: Self-pay

## 2022-08-06 DIAGNOSIS — I10 Essential (primary) hypertension: Secondary | ICD-10-CM | POA: Diagnosis not present

## 2022-08-06 DIAGNOSIS — Z7901 Long term (current) use of anticoagulants: Secondary | ICD-10-CM | POA: Diagnosis not present

## 2022-08-06 DIAGNOSIS — Z79899 Other long term (current) drug therapy: Secondary | ICD-10-CM | POA: Diagnosis not present

## 2022-08-06 DIAGNOSIS — H538 Other visual disturbances: Secondary | ICD-10-CM | POA: Insufficient documentation

## 2022-08-06 LAB — CBG MONITORING, ED: Glucose-Capillary: 94 mg/dL (ref 70–99)

## 2022-08-06 MED ORDER — AMLODIPINE BESYLATE 10 MG PO TABS: 10.0000 mg | ORAL_TABLET | Freq: Every day | ORAL | 0 refills | Status: DC

## 2022-08-06 MED ORDER — TETRACAINE HCL 0.5 % OP SOLN
2.0000 [drp] | Freq: Once | OPHTHALMIC | Status: AC
  Administered 2022-08-06: 2 [drp] via OPHTHALMIC
  Filled 2022-08-06: qty 4

## 2022-08-06 MED ORDER — FLUORESCEIN SODIUM 1 MG OP STRP
1.0000 | ORAL_STRIP | Freq: Once | OPHTHALMIC | Status: AC
  Administered 2022-08-06: 1 via OPHTHALMIC
  Filled 2022-08-06: qty 1

## 2022-08-06 NOTE — Discharge Instructions (Addendum)
Follow-up with ophthalmology.  Call the office tomorrow morning at 8:00 to get appointment for tomorrow.  Please le them know that you were seen here in the emergency department.  Take your blood pressure medication when you get home  Otherwise follow up with primary care provider  Return for new or worsening symptoms

## 2022-08-06 NOTE — ED Triage Notes (Signed)
Pt arrived POV w/ c/o L eye intermittent blurriness x 2 months. Pt reports his R eye "isn't any good". Pt in NAD.

## 2022-08-06 NOTE — ED Notes (Signed)
Refused DC vitals stated "just show me how to get out"

## 2022-08-06 NOTE — ED Provider Triage Note (Signed)
Emergency Medicine Provider Triage Evaluation Note  Derrick Andrade , a 61 y.o. male  was evaluated in triage.  Pt complains of left eye blurriness.  Patient states that he has noticed his left eye being blurry for approximately the past 2 months.  He denies any overt eye pain.  He was does report this is worse with sunlight or bright rooms.  His vision is much better and dimly lit rooms.  He notes no change in vision and given that it remains over the past 2 months.  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  He lives with his niece and her son who assist with his daily living.  Denies weakness/sensory deficits, slurred speech, facial droop, difficulty walking. Denies trauma to affected eye  Review of Systems  Positive: See abov Negative:   Physical Exam  BP (!) 152/79 (BP Location: Right Arm)   Pulse 60   Temp 97.6 F (36.4 C)   Resp 15   Ht 6' (1.829 m)   Wt 73.8 kg   SpO2 100%   BMI 22.07 kg/m  Gen:   Awake, no distress   Resp:  Normal effort  MSK:   Moves extremities without difficulty  Other:  Left eye PERRLA with no active drainage.  Left eye EOM intact.  Medical Decision Making  Medically screening exam initiated at 11:40 AM.  Appropriate orders placed.  Derrick Andrade was informed that the remainder of the evaluation will be completed by another provider, this initial triage assessment does not replace that evaluation, and the importance of remaining in the ED until their evaluation is complete.     Peter Garter, [place] 08/06/22 1142

## 2022-08-06 NOTE — ED Provider Notes (Signed)
[facility] EMERGENCY DEPARTMENT Provider Note   CSN: [id number] Arrival date & time: 08/06/22  1123     History  Chief Complaint  Patient presents with   Blurred Vision    Derrick Andrade is a 61 y.o. male history of hypertension, tobacco use, for evaluation of intermittent blurred vision to his left eye.  Had cataract surgery 1 year ago.  Denies any visual field cuts, sensation of closing curtain.  She states that "gets blurry."  Worse when he goes outside, better in dark areas.  He denies any pain.  He has chronic white drainage to bilateral eyes. States right eye vision is "no good." Currently has vision to left eye without complaints. States he has transportation issues which is why he has not gone to see the Opthamology.  He denies any surrounding facial erythema, warmth, pain with eye movement.  No recent trauma.  He denies any numbness, weakness, difficulty with word finding, TIA symptoms, amaurosis fugax  HPI     Home Medications Prior to Admission medications   Medication Sig Start Date End Date Taking? Authorizing Provider  acetaminophen (TYLENOL) 325 MG tablet Take 2 tablets (650 mg total) by mouth every 6 (six) hours as needed for mild pain (or temp > 100). 09/09/19   Juliet Rude, PA-C  amLODipine (NORVASC) 10 MG tablet Take 1 tablet (10 mg total) by mouth daily. 08/06/22 09/05/22  Caydn Justen A, PA-C  apixaban (ELIQUIS) 2.5 MG TABS tablet Take 1 tablet (2.5 mg total) by mouth 2 (two) times daily. 02/11/22 03/13/22  Lorin Glass, MD  apixaban (ELIQUIS) 2.5 MG TABS tablet Take 1 tablet (2.5 mg total) by mouth 2 (two) times daily. 02/09/22   [place] Bali, PA-C  buPROPion (WELLBUTRIN SR) 150 MG 12 hr tablet Take 1 tablet (150 mg total) by mouth daily. 02/12/22 03/14/22  Lorin Glass, MD  ergocalciferol (VITAMIN D2) 1.25 MG (50000 UT) capsule Take 1 capsule (50,000 units total) by mouth every 7 (seven) days. 02/09/22   [place] Bali, PA-C   hydrochlorothiazide (HYDRODIURIL) 12.5 MG tablet Take 1 tablet (12.5 mg total) by mouth daily. 02/12/22 03/14/22  Lorin Glass, MD  losartan (COZAAR) 100 MG tablet Take 1 tablet (100 mg total) by mouth daily. 02/12/22 03/14/22  Lorin Glass, MD      Allergies    Patient has no known allergies.    Review of Systems   Review of Systems  Constitutional: Negative.   HENT: Negative.    Eyes:  Positive for visual disturbance. Negative for photophobia, pain, discharge, redness and itching.  Respiratory: Negative.    Cardiovascular: Negative.   Gastrointestinal: Negative.   Genitourinary: Negative.   Musculoskeletal: Negative.   Skin: Negative.   Neurological: Negative.   All other systems reviewed and are negative.   Physical Exam Updated Vital Signs BP (!) 216/70 (BP Location: Right Arm)   Pulse (!) 46   Temp 97.6 F (36.4 C)   Resp 18   Ht 6' (1.829 m)   Wt 73.8 kg   SpO2 100%   BMI 22.07 kg/m  Physical Exam Vitals and nursing note reviewed.  Constitutional:      General: He is not in acute distress.    Appearance: He is well-developed. He is not ill-appearing, toxic-appearing or diaphoretic.  HENT:     Head: Normocephalic and atraumatic.  Eyes:     General: No allergic shiner, visual field deficit or scleral icterus.       Right eye:  No foreign body, discharge or hordeolum.        Left eye: No foreign body, discharge or hordeolum.     Extraocular Movements: Extraocular movements intact.     Conjunctiva/sclera: Conjunctivae normal.     Pupils: Pupils are equal, round, and reactive to light.     Comments: Bilateral eyes with clear drainage medial canthus.  No surrounding periorbital erythema or warmth.  PERRLA.  No injected conjunctiva.  Cardiovascular:     Rate and Rhythm: Normal rate and regular rhythm.  Pulmonary:     Effort: Pulmonary effort is normal. No respiratory distress.  Abdominal:     General: There is no distension.     Palpations: Abdomen is soft.   Musculoskeletal:        General: Normal range of motion.     Cervical back: Normal range of motion and neck supple.  Skin:    General: Skin is warm and dry.  Neurological:     General: No focal deficit present.     Mental Status: He is alert and oriented to person, place, and time.     Cranial Nerves: Cranial nerves 2-12 are intact.     Sensory: Sensation is intact.     Motor: Motor function is intact.     Coordination: Coordination is intact.     Gait: Gait is intact.     Comments: CN 2-12 grossly intact Equal strength bilaterally Ambulatory without ataxia    ED Results / Procedures / Treatments   Labs (all labs ordered are listed, but only abnormal results are displayed) Labs Reviewed  CBG MONITORING, ED    EKG None  Radiology No results found.  Procedures Procedures    Medications Ordered in ED Medications  tetracaine (PONTOCAINE) 0.5 % ophthalmic solution 2 drop (2 drops Left Eye Given 08/06/22 1644)  fluorescein ophthalmic strip 1 strip (1 strip Left Eye Given 08/06/22 1644)   ED Course/ Medical Decision Making/ A&P    61 year old here for evaluation of left eye blurred vision, intermittent in nature.  Symptoms occurring over the last 2 months.  He denies any eye pain.  Vision changes worse in sunlight or bright rooms, better when he goes into darker rooms or he wears his sunglasses.  Did have cataract surgery approximate 1 year ago.  Patient states he has not followed up with ophthalmologist.  He attempted to make appointment several weeks ago with his PCP however did not follow-up.  Nonfocal neuro exam.  He is currently asymptomatic.  Left eye exam without any significant findings.  Unclear why he has been having this intermittent blurry vision over the last 2 months.  We will have him follow-up with ophthalmology.  Sx do not seem consistent with a retinal artery occlusion, TIA, amaurosis fugax, dissection, renal detachment, acute angle glaucoma, CVA,  iritis.  He is hypertensive.  States he ran out of his blood pressure medication.  Will write for more.  He does not want any further work-up in the emergency department for this.  No headache, numbness, weakness, chest pain or shortness of breath.  I offered to give him his home medication here which she declined.  We will refill his prescription.  CBG within normal limits low suspicion for for hyperglycemia as cause of his blurred vision  The patient has been appropriately medically screened and/or stabilized in the ED. I have low suspicion for any other emergent medical condition which would require further screening, evaluation or treatment in the ED or require inpatient management.  Patient is hemodynamically stable and in no acute distress.  Patient able to ambulate in department prior to ED.  Evaluation does not show acute pathology that would require ongoing or additional emergent interventions while in the emergency department or further inpatient treatment.  I have discussed the diagnosis with the patient and answered all questions.  Pain is been managed while in the emergency department and patient has no further complaints prior to discharge.  Patient is comfortable with plan discussed in room and is stable for discharge at this time.  I have discussed strict return precautions for returning to the emergency department.  Patient was encouraged to follow-up with PCP/specialist refer to at discharge.                           Medical Decision Making Amount and/or Complexity of Data Reviewed External Data Reviewed: labs, radiology and notes. Labs: ordered. Decision-making details documented in ED Course.  Risk OTC drugs. Prescription drug management. Parenteral controlled substances. Decision regarding hospitalization. Diagnosis or treatment significantly limited by social determinants of health.           Final Clinical Impression(s) / ED Diagnoses Final diagnoses:  Blurred  vision    Rx / DC Orders ED Discharge Orders          Ordered    amLODipine (NORVASC) 10 MG tablet  Daily        08/06/22 1830              Thersia Petraglia A, PA-C 08/06/22 1855    Gloris Manchester, MD 08/07/22 [phone]

## 2022-08-07 ENCOUNTER — Telehealth: Payer: Self-pay

## 2022-08-07 NOTE — Telephone Encounter (Signed)
I tried to contact patient #  [phone] to schedule him an appointment at [facility] with Dr Delford Field. Three attempts made and the recording stated that the call cannot be completed at this time.

## 2022-08-08 ENCOUNTER — Other Ambulatory Visit: Payer: Self-pay

## 2022-08-13 NOTE — Telephone Encounter (Signed)
I tried to contact patient #  [phone]  again to schedule him an appointment at [facility] with Dr Delford Field.  The recording stated that the call cannot be completed at this time.

## 2022-08-21 NOTE — Telephone Encounter (Signed)
I tried to contact patient again to schedule him an appointment at [facility] with Dr Delford Field.  The recording stated that the call cannot be completed at this time.    Letter sent to patient requesting he contact this office to schedule the appointment as we have not been able to reach him.

## 2023-01-01 ENCOUNTER — Other Ambulatory Visit: Payer: Self-pay

## 2023-01-01 DIAGNOSIS — H35372 Puckering of macula, left eye: Secondary | ICD-10-CM | POA: Diagnosis not present

## 2023-01-01 MED ORDER — PREDNISOLONE ACETATE 1 % OP SUSP
1.0000 [drp] | Freq: Three times a day (TID) | OPHTHALMIC | 11 refills | Status: AC
  Filled 2023-01-01: qty 5, 30d supply, fill #0

## 2023-01-07 ENCOUNTER — Other Ambulatory Visit: Payer: Self-pay

## 2023-09-07 ENCOUNTER — Other Ambulatory Visit: Payer: Self-pay

## 2023-09-07 ENCOUNTER — Encounter (HOSPITAL_COMMUNITY): Payer: Self-pay

## 2023-09-07 ENCOUNTER — Emergency Department (HOSPITAL_COMMUNITY)
Admission: EM | Admit: 2023-09-07 | Discharge: 2023-09-07 | Disposition: A | Discharge status: Home / Self Care | Payer: Medicaid Other | Attending: Emergency Medicine | Admitting: Emergency Medicine

## 2023-09-07 DIAGNOSIS — F17200 Nicotine dependence, unspecified, uncomplicated: Secondary | ICD-10-CM | POA: Diagnosis not present

## 2023-09-07 DIAGNOSIS — H538 Other visual disturbances: Secondary | ICD-10-CM | POA: Diagnosis present

## 2023-09-07 DIAGNOSIS — I10 Essential (primary) hypertension: Secondary | ICD-10-CM | POA: Insufficient documentation

## 2023-09-07 DIAGNOSIS — Z7901 Long term (current) use of anticoagulants: Secondary | ICD-10-CM | POA: Insufficient documentation

## 2023-09-07 DIAGNOSIS — Z79899 Other long term (current) drug therapy: Secondary | ICD-10-CM | POA: Insufficient documentation

## 2023-09-07 MED ORDER — TETRACAINE HCL 0.5 % OP SOLN: 2.0000 [drp] | Freq: Once | OPHTHALMIC | Status: DC

## 2023-09-07 NOTE — ED Triage Notes (Signed)
Pt states left eye is blurry and he is losing his vision. Pt has had 2 eye surgeries on this eye already. Has an appt 10/8 for eye surgery, pt is unsure what procedure he is having on his eye. Hx of cataracts. Denies headache, dizziness.

## 2023-09-07 NOTE — ED Provider Notes (Signed)
[place] EMERGENCY DEPARTMENT AT [facility] Provider Note   CSN: [id number] Arrival date & time: 09/07/23  1059     History  Chief Complaint  Patient presents with   Eye Problem    Derrick Andrade is a 62 y.o. male.  62 year old male with history of HTN, tobacco use, decreased vision of the left eye, here today with worsening vision loss. Patient is blind in his right eye. Noticed progressively worsening blurry vision. Had cataract surgery 06/2023, gradual worsening since that time. Notes tearing with crusting around the eye.        Home Medications Prior to Admission medications   Medication Sig Start Date End Date Taking? Authorizing Provider  acetaminophen (TYLENOL) 325 MG tablet Take 2 tablets (650 mg total) by mouth every 6 (six) hours as needed for mild pain (or temp > 100). 09/09/19   Juliet Rude, PA-C  amLODipine (NORVASC) 10 MG tablet Take 1 tablet (10 mg total) by mouth daily. 08/06/22 09/05/22  Henderly, Britni A, PA-C  apixaban (ELIQUIS) 2.5 MG TABS tablet Take 1 tablet (2.5 mg total) by mouth 2 (two) times daily. 02/11/22 03/13/22  Lorin Glass, MD  apixaban (ELIQUIS) 2.5 MG TABS tablet Take 1 tablet (2.5 mg total) by mouth 2 (two) times daily. 02/09/22   West Bali, PA-C  buPROPion (WELLBUTRIN SR) 150 MG 12 hr tablet Take 1 tablet (150 mg total) by mouth daily. 02/12/22 03/14/22  Lorin Glass, MD  ergocalciferol (VITAMIN D2) 1.25 MG (50000 UT) capsule Take 1 capsule (50,000 units total) by mouth every 7 (seven) days. 02/09/22   West Bali, PA-C  hydrochlorothiazide (HYDRODIURIL) 12.5 MG tablet Take 1 tablet (12.5 mg total) by mouth daily. 02/12/22 03/14/22  Lorin Glass, MD  losartan (COZAAR) 100 MG tablet Take 1 tablet (100 mg total) by mouth daily. 02/12/22 03/14/22  Lorin Glass, MD  prednisoLONE acetate (PRED FORTE) 1 % ophthalmic suspension Place 1 drop into the left eye 3 (three) times daily. 01/01/23         Allergies    Patient has no known  allergies.    Review of Systems   Review of Systems Negative except as per HPI Physical Exam Updated Vital Signs BP (!) 146/72   Pulse 68   Temp 97.9 F (36.6 C) (Oral)   Resp 17   Ht 6' (1.829 m)   Wt 74.8 kg   SpO2 100%   BMI 22.38 kg/m  Physical Exam Vitals and nursing note reviewed.  Constitutional:      General: He is not in acute distress.    Appearance: He is well-developed. He is not diaphoretic.  HENT:     Head: Normocephalic and atraumatic.  Pulmonary:     Effort: Pulmonary effort is normal.  Neurological:     Mental Status: He is alert and oriented to person, place, and time.  Psychiatric:        Behavior: Behavior normal.     ED Results / Procedures / Treatments   Labs (all labs ordered are listed, but only abnormal results are displayed) Labs Reviewed - No data to display  EKG None  Radiology No results found.  Procedures Procedures    Medications Ordered in ED Medications  tetracaine (PONTOCAINE) 0.5 % ophthalmic solution 2 drop (has no administration in time range)    ED Course/ Medical Decision Making/ A&P  Medical Decision Making  62 year old male presents with complaint of diminished vision in his left eye.  Patient was offered workup.  He has refused any further evaluation and workup in the emergency room today.  He states that he needs to leave at this time.  Offered to check IOP, consult patient's ophthalmologist.  Patient is frustrated and refusing to stay in the emergency room any longer.  I have recommended patient contact his ophthalmologist and advised that he can return to any emergency room at any time for any further evaluation.        Final Clinical Impression(s) / ED Diagnoses Final diagnoses:  Vision blurred    Rx / DC Orders ED Discharge Orders     None         Jeannie Fend, PA-C 09/07/23 1430    Terald Sleeper, MD 09/07/23 1743

## 2023-09-18 ENCOUNTER — Other Ambulatory Visit: Payer: Self-pay | Admitting: Critical Care Medicine

## 2023-09-18 DIAGNOSIS — Z1211 Encounter for screening for malignant neoplasm of colon: Secondary | ICD-10-CM

## 2023-09-18 DIAGNOSIS — Z1212 Encounter for screening for malignant neoplasm of rectum: Secondary | ICD-10-CM

## 2023-10-19 IMAGING — DX DG HIP (WITH OR WITHOUT PELVIS) 1V PORT*R*
3 series · 3 of 3 positions shown · non-contrast
Comparison: 02/07/2022

CLINICAL DATA: Postop

EXAM:
DG HIP (WITH OR WITHOUT PELVIS) 1V PORT RIGHT

[pelvis ap]
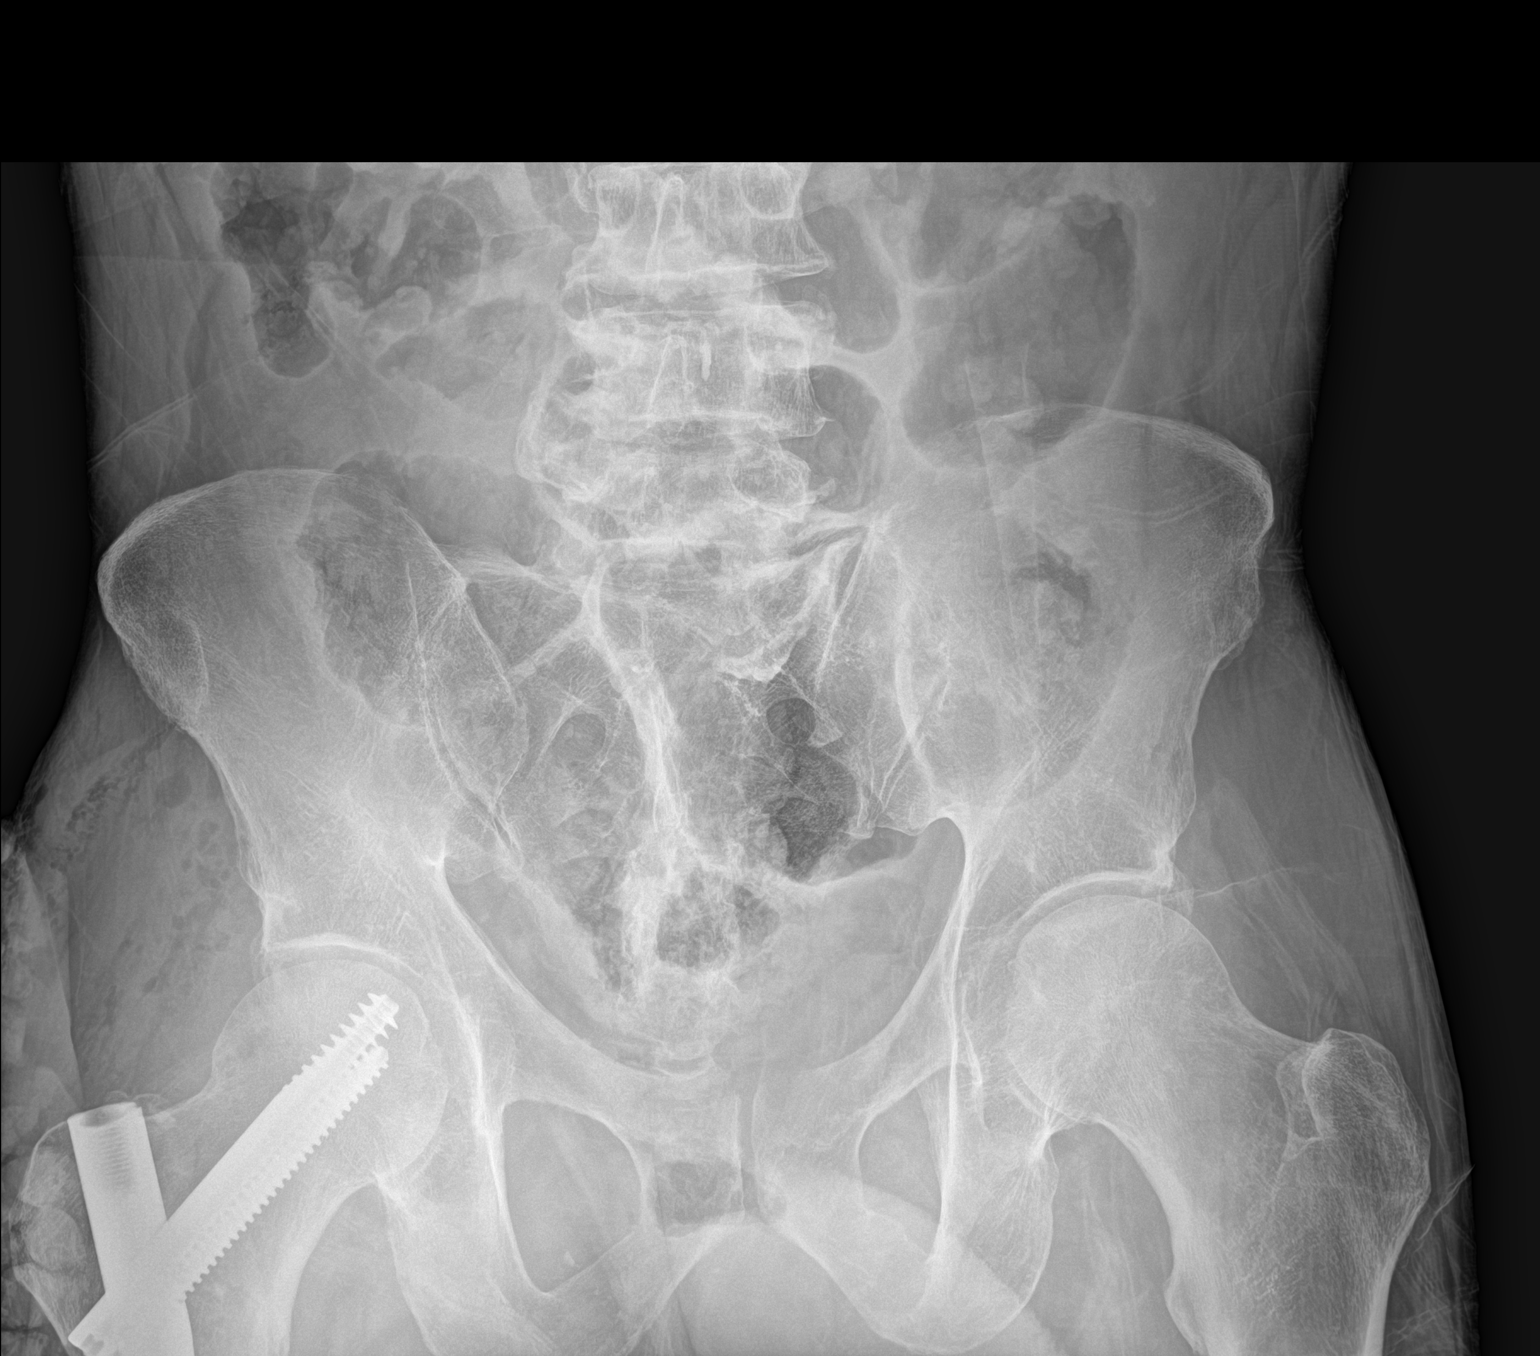

[hip frog leg]
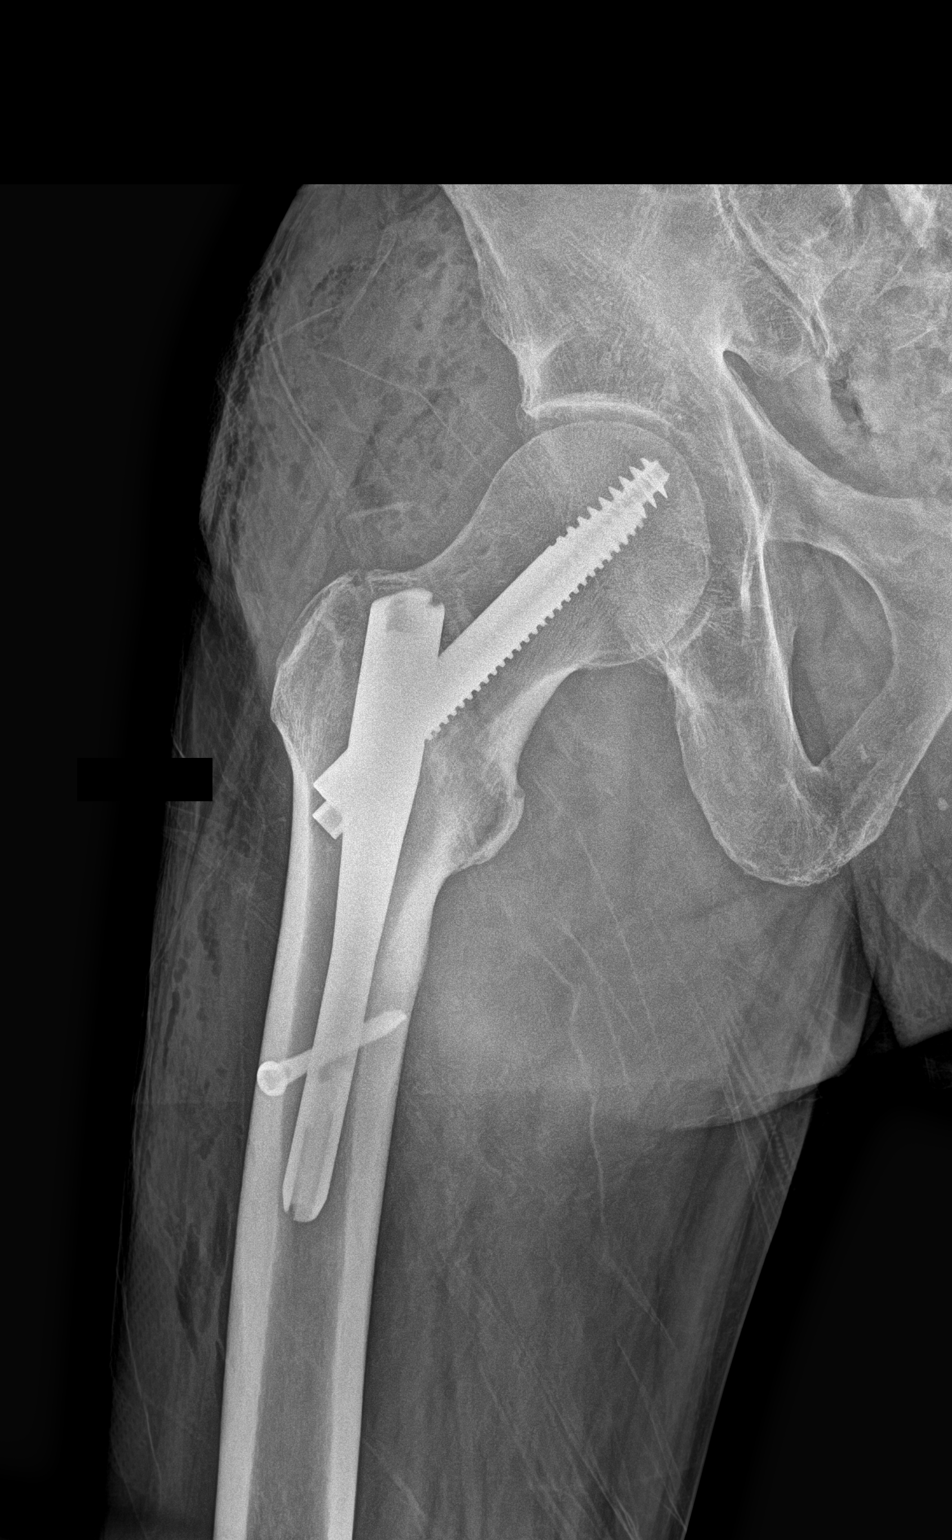

[hip ap]
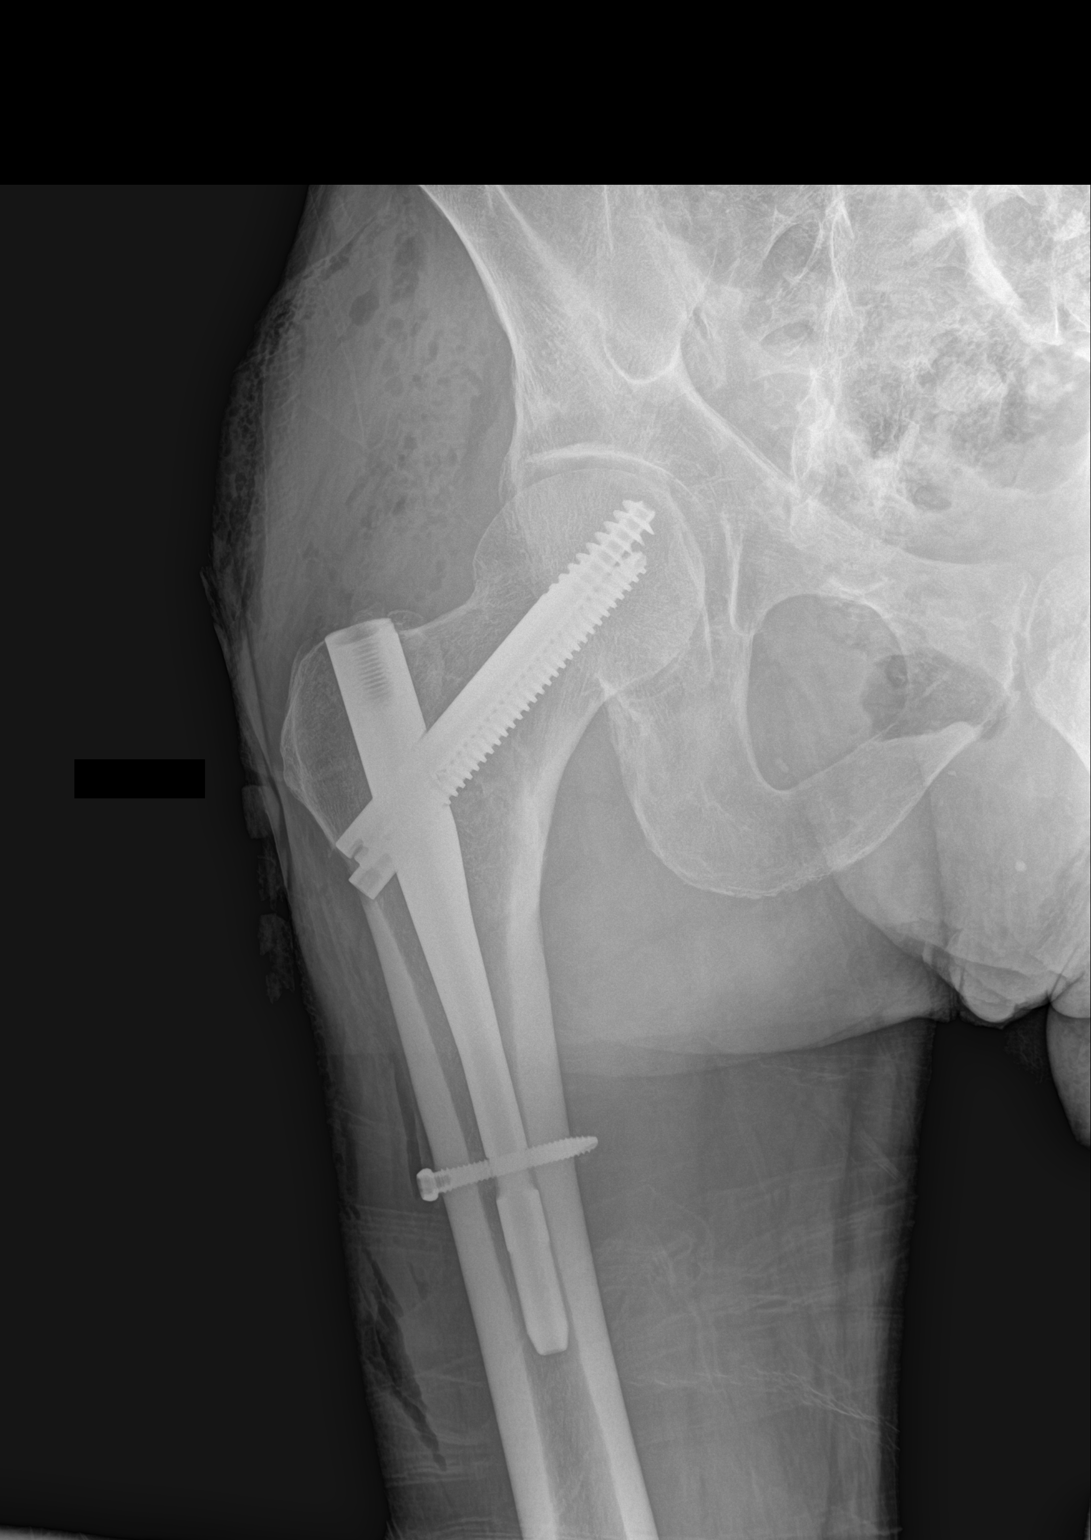

[3 of 3 positions shown; findings below may reference images not displayed]

FINDINGS: Pubic symphysis is intact. Interval intramedullary rod and distal
screw fixation of the right femur of intertrochanteric fracture.
Anatomic alignment. Gas in the soft tissues consistent with recent
surgery
IMPRESSION: Interval surgical fixation of right intertrochanteric fracture with
expected postsurgical changes.

## 2023-10-19 IMAGING — DX DG CHEST 1V PORT
1 series · 1 of 1 positions shown · non-contrast
Comparison: 09/06/2019

CLINICAL DATA: Preop

EXAM:
PORTABLE CHEST 1 VIEW

[chest ap]
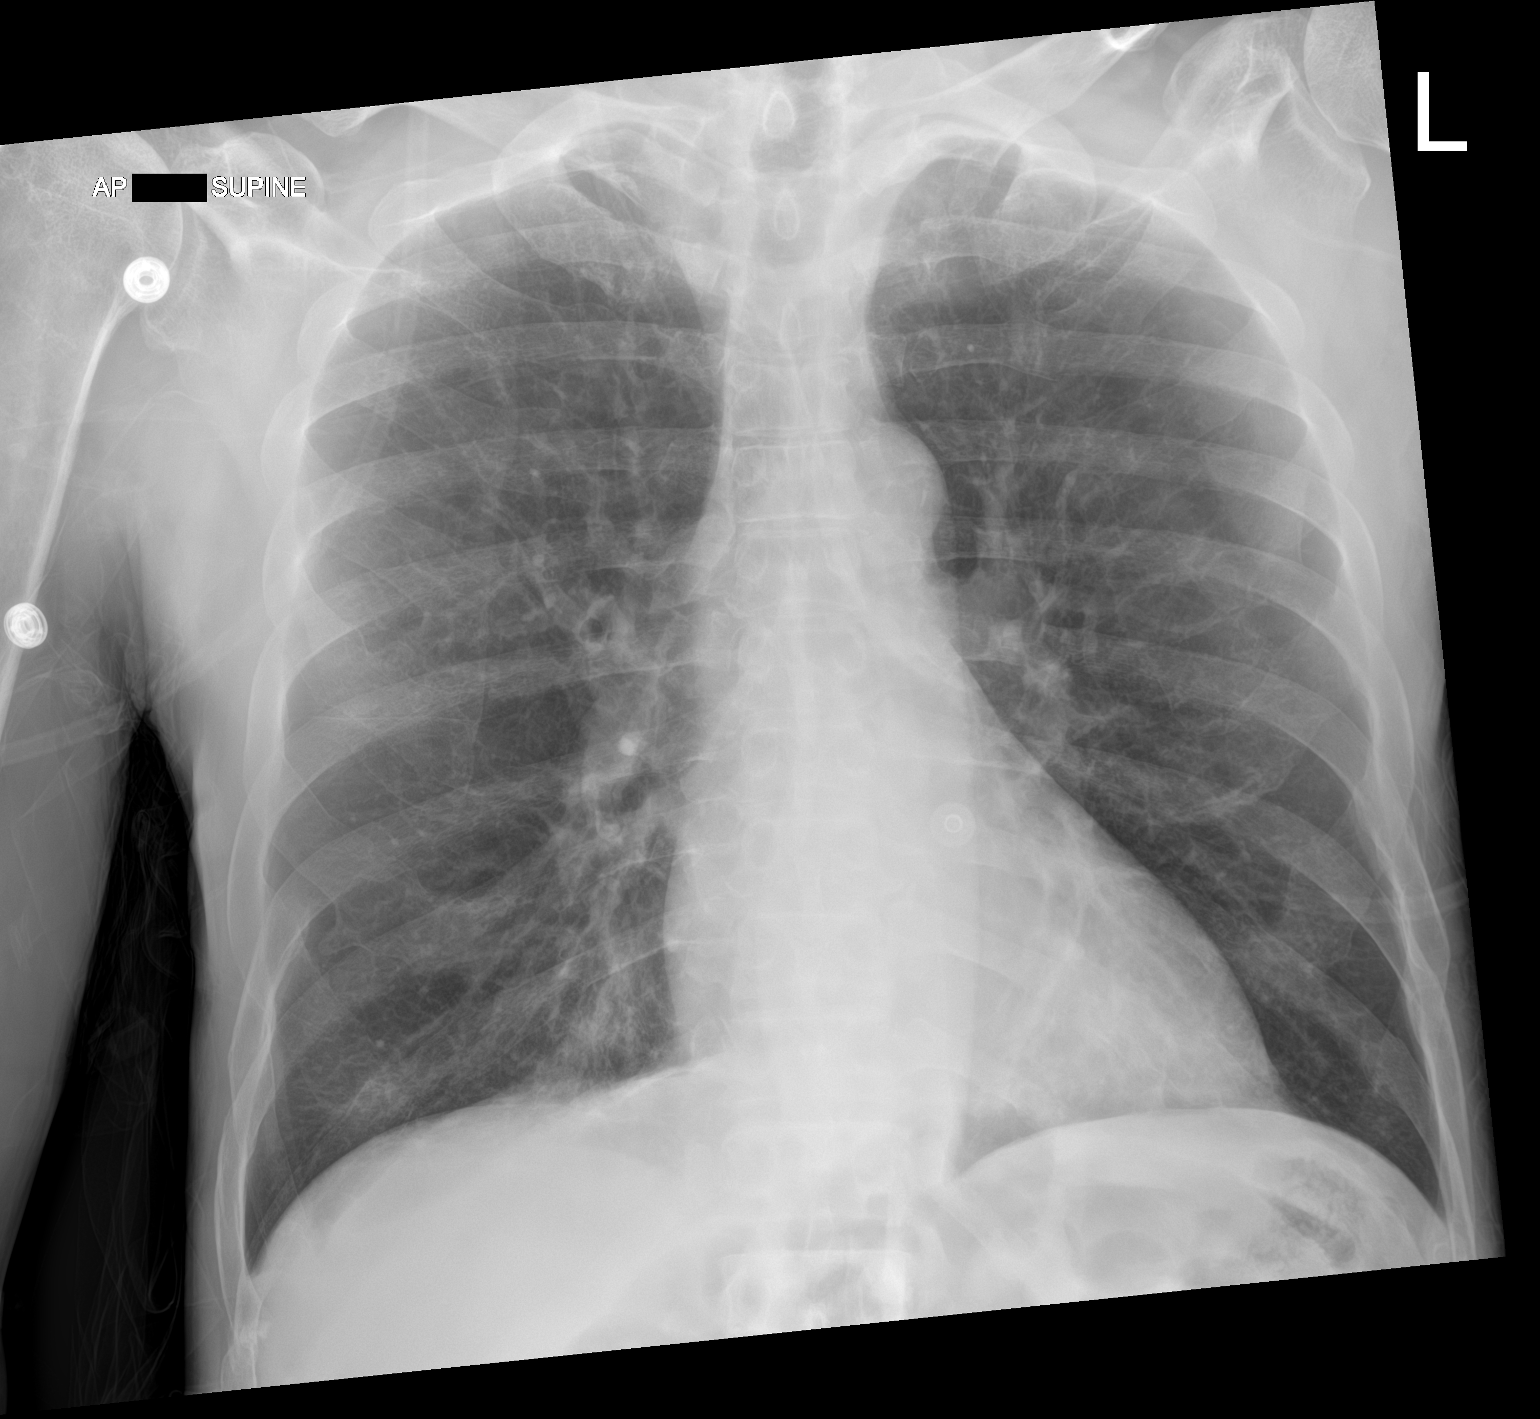

[1 of 1 positions shown; findings below may reference images not displayed]

FINDINGS: Normal heart size and mediastinal contours. Mild streaky density at
the bases. No acute infiltrate or edema. No effusion or
pneumothorax. No acute osseous findings.
IMPRESSION: Mild streaky density at the bases usually from atelectasis.

## 2023-10-19 IMAGING — DX DG HIP (WITH OR WITHOUT PELVIS) 2-3V*R*
2 series · 2 of 2 positions shown · non-contrast
Comparison: None.

CLINICAL DATA: Right hip pain

EXAM:
DG HIP (WITH OR WITHOUT PELVIS) 2-3V RIGHT

[pelvis ap]
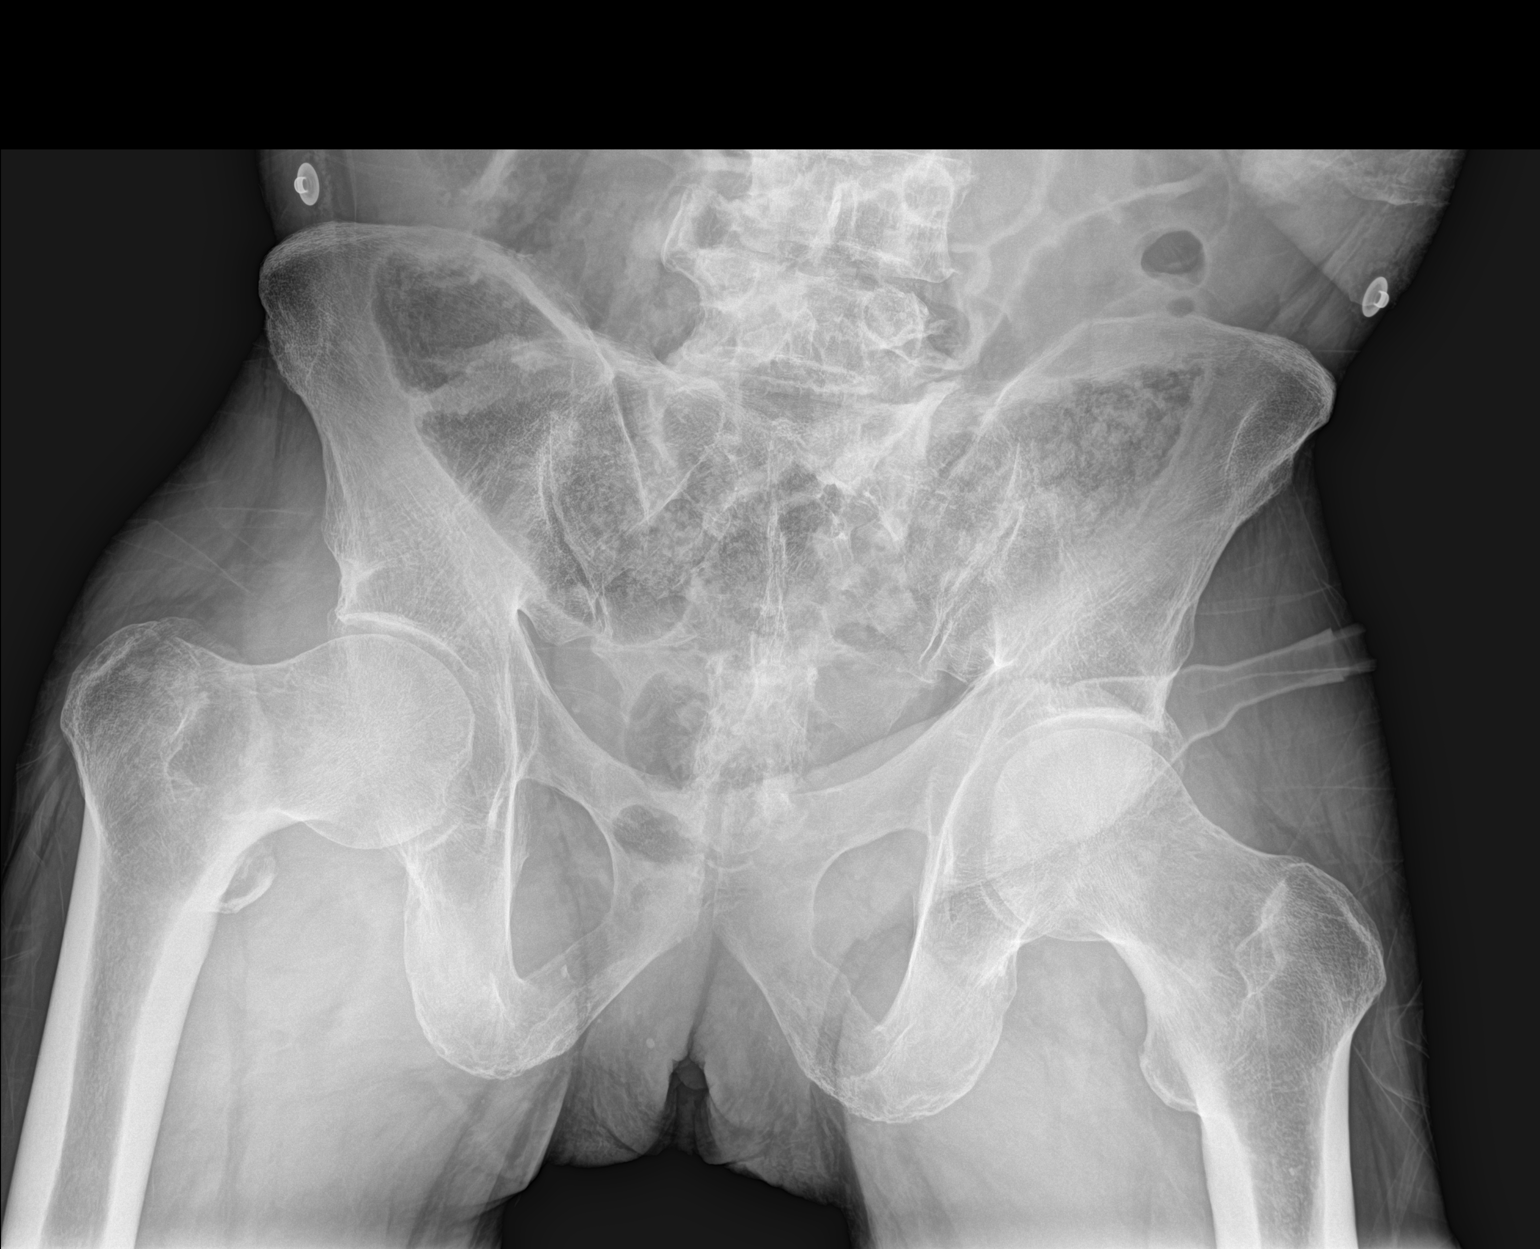

[hip ap]
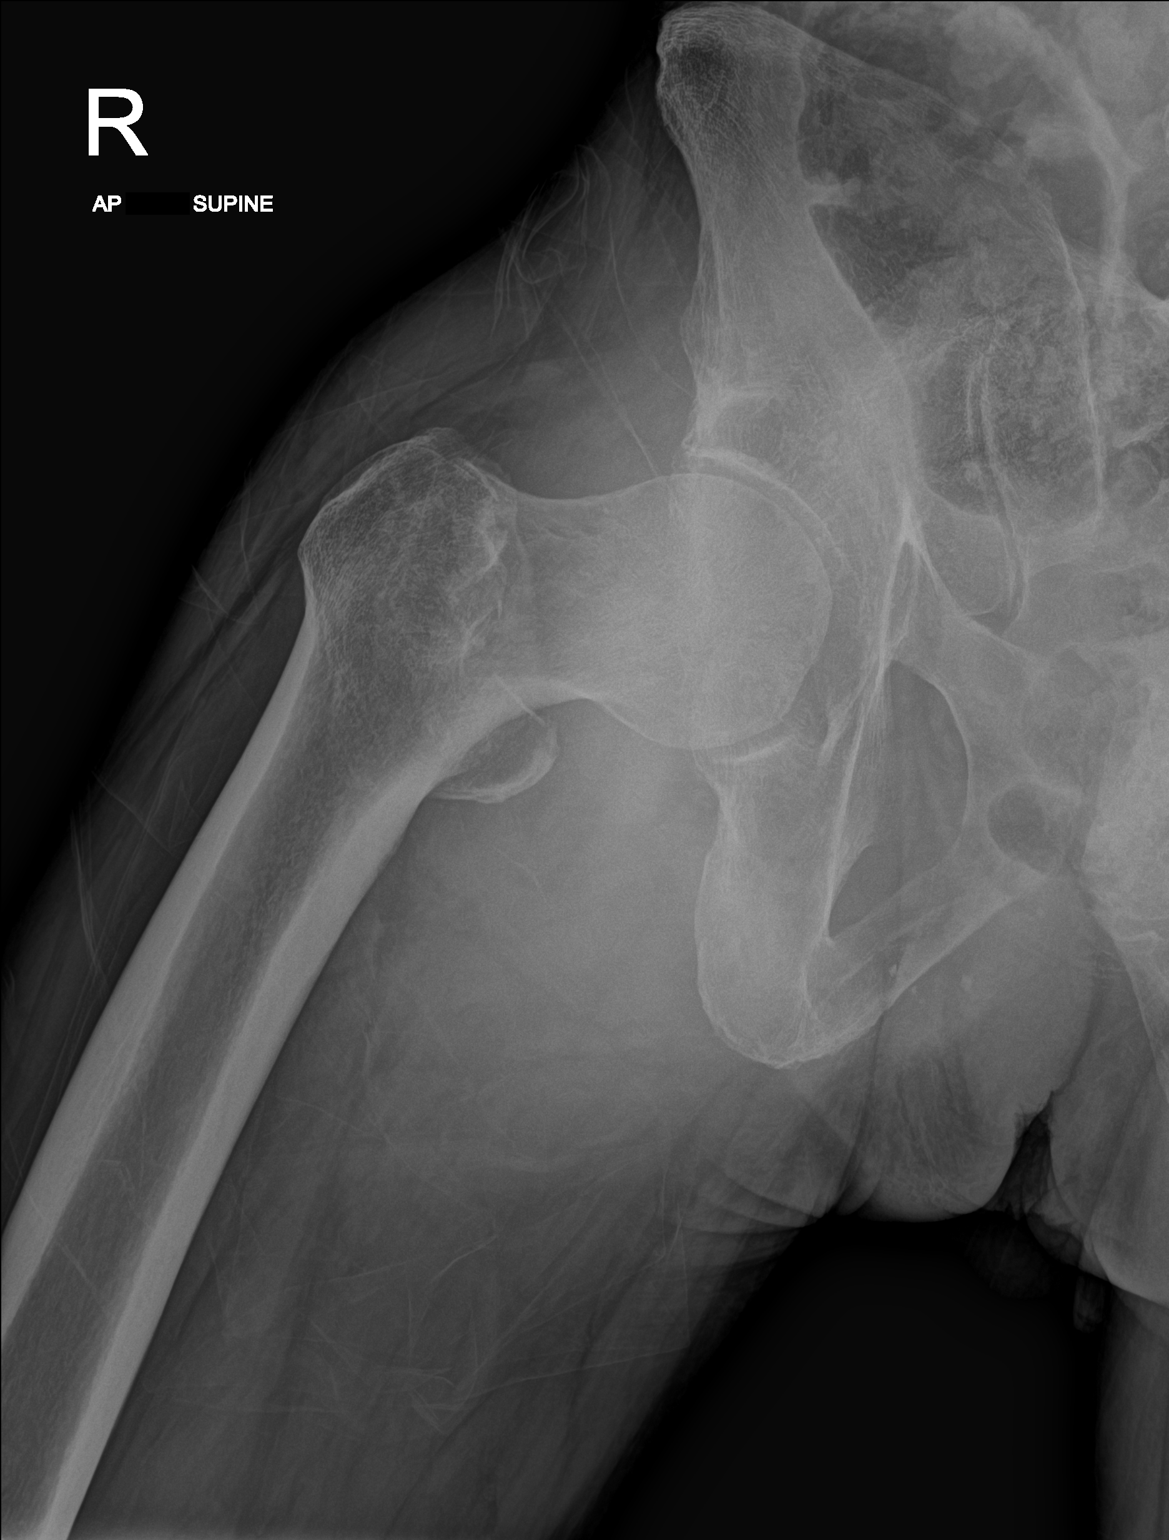

[2 of 2 positions shown; findings below may reference images not displayed]

FINDINGS: A intratrochanteric fracture of the right hip is present, likely
acute in nature with a lucency seen within the greater trochanter
and acute avulsion of the lesser trochanter noted. There is varus
angulation of the right femoral neck, new since prior CT examination
of 09/06/2019. No dislocation. Hip joint spaces are preserved.
Limited evaluation of the left hip is unremarkable. Soft tissues are
unremarkable.
IMPRESSION: Suspected acute intratrochanteric fracture of the right hip with
avulsion of the lesser trochanter and resultant varus angulation.
This could be confirmed with CT imaging.

## 2024-01-29 ENCOUNTER — Inpatient Hospital Stay: Payer: Self-pay | Admitting: Nurse Practitioner

## 2024-11-24 ENCOUNTER — Other Ambulatory Visit (INDEPENDENT_AMBULATORY_CARE_PROVIDER_SITE_OTHER): Payer: Self-pay | Admitting: Primary Care

## 2024-11-24 ENCOUNTER — Encounter (INDEPENDENT_AMBULATORY_CARE_PROVIDER_SITE_OTHER): Payer: Self-pay | Admitting: Primary Care

## 2024-11-24 ENCOUNTER — Ambulatory Visit (INDEPENDENT_AMBULATORY_CARE_PROVIDER_SITE_OTHER): Admitting: Primary Care

## 2024-11-24 VITALS — BP 160/84 | Resp 16 | Ht 73.0 in | Wt 145.8 lb

## 2024-11-24 DIAGNOSIS — Z720 Tobacco use: Secondary | ICD-10-CM

## 2024-11-24 DIAGNOSIS — I10 Essential (primary) hypertension: Secondary | ICD-10-CM

## 2024-11-24 DIAGNOSIS — Z13220 Encounter for screening for lipoid disorders: Secondary | ICD-10-CM

## 2024-11-24 DIAGNOSIS — Z23 Encounter for immunization: Secondary | ICD-10-CM

## 2024-11-24 DIAGNOSIS — E612 Magnesium deficiency: Secondary | ICD-10-CM

## 2024-11-24 DIAGNOSIS — K056 Periodontal disease, unspecified: Secondary | ICD-10-CM | POA: Diagnosis not present

## 2024-11-24 DIAGNOSIS — F10221 Alcohol dependence with intoxication delirium: Secondary | ICD-10-CM

## 2024-11-24 MED ORDER — LOSARTAN POTASSIUM 100 MG PO TABS: 100.0000 mg | ORAL_TABLET | Freq: Every day | ORAL | 1 refills | Status: AC

## 2024-11-24 MED ORDER — AMLODIPINE BESYLATE 10 MG PO TABS: 10.0000 mg | ORAL_TABLET | Freq: Every day | ORAL | 1 refills | Status: AC

## 2024-11-24 MED ORDER — HYDROCHLOROTHIAZIDE 12.5 MG PO TABS: 12.5000 mg | ORAL_TABLET | Freq: Every day | ORAL | 1 refills | Status: AC

## 2024-11-24 NOTE — Patient Instructions (Signed)

## 2024-11-24 NOTE — Progress Notes (Unsigned)
 New Patient Office Visit  Subjective    Patient ID: Derrick Andrade male  DOB: [birth year]  Age: 63 y.o. MRN: [id number]   CC:  Right eye can't see out of needs surgery but BP is elevated out of medications HPI     New Patient (Initial Visit)    Additional comments: Hypertension been out of medication for 2 weeks   Cyst on face pt is requesting to have it removed       Last edited by Casimir Juvenal SAUNDERS, RMA on 11/24/2024  8:58 AM.      HPI  Derrick Andrade is a 63 year old male under weight and poor dentition.  Elevated BP -takes reading at home -runs hight. Out of meds Medications Ordered Prior to Encounter[1]   Allergies[2]  Past Medical History:  Diagnosis Date   Alcohol dependence (HCC)    Hypertension    Perirectal abscess 09/06/2019   Tobacco dependence    Wrist fracture    left     Past Surgical History:  Procedure Laterality Date   INCISE AND DRAIN ABCESS     boil on buttock   INCISION AND DRAINAGE PERIRECTAL ABSCESS N/A 09/06/2019   Procedure: IRRIGATION AND DEBRIDEMENT PERIRECTAL ABSCESS;  Surgeon: Signe Mitzie DELENA, MD;  Location: [facility] OR;  Service: General;  Laterality: N/A;   INTRAMEDULLARY (IM) NAIL INTERTROCHANTERIC Right 02/07/2022   Procedure: INTRAMEDULLARY (IM) NAIL INTERTROCHANTRIC;  Surgeon: Kendal Franky SQUIBB, MD;  Location: [facility] OR;  Service: Orthopedics;  Laterality: Right;   PARS PLANA VITRECTOMY Right 03/14/2015   Procedure: Exploration Right Ruptured Globe, Vitrous Tap, Antibiotic Injection, PARS PLANA VITRECTOMY 25 GAUGE WITH LASER;  Surgeon: Onesimo Blanch, MD;  Location: [facility] OR;  Service: Ophthalmology;  Laterality: Right;     Family History  Family history unknown: Yes    Social History   Socioeconomic History   Marital status: Single    Spouse name: Not on file   Number of children: Not on file   Years of education: Not on file   Highest education level: Not on file  Occupational History   Not on file  Tobacco Use   Smoking status:  Every Day    Current packs/day: 0.50    Types: Cigarettes   Smokeless tobacco: Never   Tobacco comments:    5-6 cigarettes daily  Substance and Sexual Activity   Alcohol use: Yes    Alcohol/week: 1.0 - 2.0 standard drink of alcohol    Types: 1 - 2 Cans of beer per week    Comment: 1-2 beers daily   Drug use: No   Sexual activity: Not on file  Other Topics Concern   Not on file  Social History Narrative   Not on file   Social Drivers of Health   Tobacco Use: High Risk (11/24/2024)   Patient History    Smoking Tobacco Use: Every Day    Smokeless Tobacco Use: Never    Passive Exposure: Not on file  Financial Resource Strain: Not on file  Food Insecurity: Low Risk (11/18/2022)   Received from [facility]   Epic    Within the past 12 months, you worried that your food would run out before you got money to buy more: Never true    Within the past 12 months, the food you bought just didn't last and you didn't have money to get more: Not on file  Transportation Needs: Not on file (11/18/2022)  Physical Activity: Not on file  Stress: Not  on file  Social Connections: Not on file  Intimate Partner Violence: Low Risk  (11/18/2022)   Received from [facility] visits prior to 02/14/2023.   Safety    How often does anyone, including family and friends, physically hurt you?: Never    How often does anyone, including family and friends, insult or talk down to you?: Never    How often does anyone, including family and friends, threaten you with harm?: Never    How often does anyone, including family and friends, scream or curse at you?: Never  Depression (PHQ2-9): Not on file  Alcohol Screen: Not on file  Housing: Low Risk (11/18/2022)   Received from [facility]   Epic    What is your living situation today?: I have a steady place to live    Think about the place you live. Do you have problems with any of the following? Choose all that apply:: None/None on this list   Utilities: Low Risk (11/18/2022)   Received from [facility]   Utilities    In the past 12 months has the electric, gas, oil, or water company threatened to shut off services in your home? : No  Health Literacy: Not on file       Health Maintenance  Topic Date Due   Pneumococcal Vaccine for age over 61 (1 of 2 - PCV) Never done   Stool Blood Test  Never done   Cologuard (Stool DNA test)  Never done   Zoster (Shingles) Vaccine (1 of 2) Never done   Flu Shot  07/15/2024   COVID-19 Vaccine (4 - 2025-26 season) 08/15/2024   DTaP/Tdap/Td vaccine (2 - Td or Tdap) 11/05/2030   Hepatitis C Screening  Completed   HIV Screening  Completed   Hepatitis B Vaccine  Aged Out   HPV Vaccine  Aged Out   Meningitis B Vaccine  Aged Out    Objective    BP (!) 175/88 (BP Location: Right Arm, Patient Position: Sitting, Cuff Size: Normal)   Resp 16   Ht 6' 1 (1.854 m)   Wt 145 lb 12.8 oz (66.1 kg)   BMI 19.24 kg/m  {Vitals History (Optional):23777}   Physical Exam Vitals reviewed.  HENT:     Head: Normocephalic.     Right Ear: Tympanic membrane and external ear normal.     Left Ear: Tympanic membrane and external ear normal.     Nose: Nose normal.  Eyes:     Extraocular Movements: Extraocular movements intact.     Pupils: Pupils are equal, round, and reactive to light.  Cardiovascular:     Rate and Rhythm: Normal rate and regular rhythm.  Pulmonary:     Effort: Pulmonary effort is normal.     Breath sounds: Normal breath sounds.  Abdominal:     General: Bowel sounds are normal. There is distension.     Palpations: Abdomen is soft.  Musculoskeletal:        General: Normal range of motion.  Skin:    General: Skin is warm and dry.  Neurological:     Mental Status: He is alert and oriented to person, place, and time.  Psychiatric:        Mood and Affect: Mood normal.        Behavior: Behavior normal.        Thought Content: Thought content normal.        Judgment: Judgment  normal.     {Labs (optional):23779}  Assessment & Plan:   There are no diagnoses linked to this encounter. There are no diagnoses linked to this encounter.   Follow-up:  No follow-ups on file.  The above assessment and management plan was discussed with the patient. The patient verbalized understanding of and has agreed to the management plan. Patient is aware to call the clinic if symptoms fail to improve or worsen. Patient is aware when to return to the clinic for a follow-up visit. Patient educated on when it is appropriate to go to the emergency department.   Rosaline Bohr, NP-C     [1]  Current Outpatient Medications on File Prior to Visit  Medication Sig Dispense Refill   acetaminophen  (TYLENOL ) 325 MG tablet Take 2 tablets (650 mg total) by mouth every 6 (six) hours as needed for mild pain (or temp > 100).     amLODipine  (NORVASC ) 10 MG tablet Take 1 tablet (10 mg total) by mouth daily. 30 tablet 0   apixaban  (ELIQUIS ) 2.5 MG TABS tablet Take 1 tablet (2.5 mg total) by mouth 2 (two) times daily. 60 tablet 0   apixaban  (ELIQUIS ) 2.5 MG TABS tablet Take 1 tablet (2.5 mg total) by mouth 2 (two) times daily. 60 tablet 0   buPROPion  (WELLBUTRIN  SR) 150 MG 12 hr tablet Take 1 tablet (150 mg total) by mouth daily. 30 tablet 0   ergocalciferol  (VITAMIN D2) 1.25 MG (50000 UT) capsule Take 1 capsule (50,000 units total) by mouth every 7 (seven) days. 8 capsule 0   hydrochlorothiazide  (HYDRODIURIL ) 12.5 MG tablet Take 1 tablet (12.5 mg total) by mouth daily. 30 tablet 0   losartan  (COZAAR ) 100 MG tablet Take 1 tablet (100 mg total) by mouth daily. 30 tablet 0   prednisoLONE  acetate (PRED FORTE ) 1 % ophthalmic suspension Place 1 drop into the left eye 3 (three) times daily. 10 mL 11   No current facility-administered medications on file prior to visit.  [2] No Known Allergies

## 2024-11-25 LAB — SPECIMEN STATUS REPORT

## 2024-11-25 LAB — MAGNESIUM: Magnesium: 2 mg/dL (ref 1.6–2.3)

## 2024-11-26 LAB — COMPREHENSIVE METABOLIC PANEL WITH GFR
ALT: 9 IU/L (ref 0–44)
AST: 16 IU/L (ref 0–40)
Albumin: 3.9 g/dL (ref 3.9–4.9)
Alkaline Phosphatase: 97 IU/L (ref 47–123)
BUN/Creatinine Ratio: 10 (ref 10–24)
BUN: 11 mg/dL (ref 8–27)
Basophils Absolute: 0 x10E3/uL (ref 0.0–0.2)
Basos: 0 %
Bilirubin Total: 0.3 mg/dL (ref 0.0–1.2)
CO2: 21 mmol/L (ref 20–29)
Calcium: 9.3 mg/dL (ref 8.6–10.2)
Chloride: 105 mmol/L (ref 96–106)
Cholesterol, Total: 158 mg/dL (ref 100–199)
Creatinine, Ser: 1.1 mg/dL (ref 0.76–1.27)
EOS (ABSOLUTE): 0.1 x10E3/uL (ref 0.0–0.4)
Eos: 1 %
Free T4: 1.01 ng/dL (ref 0.82–1.77)
GGT: 27 IU/L (ref 0–65)
Globulin, Total: 3.3 g/dL (ref 1.5–4.5)
Glucose: 83 mg/dL (ref 70–99)
HDL: 80 mg/dL (ref >39)
Hematocrit: 36.2 % — ABNORMAL LOW (ref 37.5–51.0)
Hemoglobin: 10.7 g/dL — ABNORMAL LOW (ref 13.0–17.7)
Immature Grans (Abs): 0 x10E3/uL (ref 0.0–0.1)
Immature Granulocytes: 0 %
Iron: 82 ug/dL (ref 38–169)
LDL Chol Calc (NIH): 63 mg/dL (ref 0–99)
LDL/HDL Ratio: 0.8 ratio (ref 0.0–3.6)
Lymphocytes Absolute: 2.5 x10E3/uL (ref 0.7–3.1)
Lymphs: 35 %
MCH: 25.9 pg — ABNORMAL LOW (ref 26.6–33.0)
MCHC: 29.6 g/dL — ABNORMAL LOW (ref 31.5–35.7)
MCV: 88 fL (ref 79–97)
Monocytes Absolute: 0.4 x10E3/uL (ref 0.1–0.9)
Monocytes: 5 %
Neutrophils Absolute: 4 x10E3/uL (ref 1.4–7.0)
Neutrophils: 58 %
Platelets: 288 x10E3/uL (ref 150–450)
Potassium: 4.3 mmol/L (ref 3.5–5.2)
RBC: 4.13 x10E6/uL — ABNORMAL LOW (ref 4.14–5.80)
RDW: 14.1 % (ref 11.6–15.4)
Sodium: 146 mmol/L — ABNORMAL HIGH (ref 134–144)
T3, Total: 91 ng/dL (ref 71–180)
TSH: 2.13 u[IU]/mL (ref 0.450–4.500)
Total Protein: 7.2 g/dL (ref 6.0–8.5)
Triglycerides: 81 mg/dL (ref 0–149)
VLDL Cholesterol Cal: 15 mg/dL (ref 5–40)
WBC: 7 x10E3/uL (ref 3.4–10.8)
eGFR: 76 mL/min/1.73 (ref >59)

## 2024-11-26 LAB — LIPID PANEL: Chol/HDL Ratio: 2 ratio (ref 0.0–5.0)

## 2024-11-28 ENCOUNTER — Ambulatory Visit (INDEPENDENT_AMBULATORY_CARE_PROVIDER_SITE_OTHER): Payer: Self-pay | Admitting: Primary Care

## 2024-12-04 ENCOUNTER — Ambulatory Visit (INDEPENDENT_AMBULATORY_CARE_PROVIDER_SITE_OTHER): Payer: Self-pay | Admitting: Primary Care

## 2024-12-12 ENCOUNTER — Ambulatory Visit (INDEPENDENT_AMBULATORY_CARE_PROVIDER_SITE_OTHER): Payer: Self-pay | Admitting: Primary Care

## 2024-12-12 ENCOUNTER — Encounter (INDEPENDENT_AMBULATORY_CARE_PROVIDER_SITE_OTHER): Payer: Self-pay | Admitting: Primary Care

## 2024-12-12 VITALS — BP 100/63 | HR 66 | Resp 16 | Wt 141.0 lb

## 2024-12-12 DIAGNOSIS — Z0130 Encounter for examination of blood pressure without abnormal findings: Secondary | ICD-10-CM

## 2024-12-12 DIAGNOSIS — Z1211 Encounter for screening for malignant neoplasm of colon: Secondary | ICD-10-CM

## 2024-12-12 NOTE — Progress Notes (Signed)
 " [facility]   Derrick Andrade is a 63 y.o. male presents for hypertension evaluation, Denies shortness of breath, headaches, chest pain or lower extremity edema, sudden onset, vision changes, unilateral weakness, dizziness, paresthesias    Past Medical History:  Diagnosis Date   Alcohol dependence (HCC)    Hypertension    Perirectal abscess 09/06/2019   Tobacco dependence    Wrist fracture    left   Past Surgical History:  Procedure Laterality Date   INCISE AND DRAIN ABCESS     boil on buttock   INCISION AND DRAINAGE PERIRECTAL ABSCESS N/A 09/06/2019   Procedure: IRRIGATION AND DEBRIDEMENT PERIRECTAL ABSCESS;  Surgeon: Signe Mitzie DELENA, MD;  Location: [facility] OR;  Service: General;  Laterality: N/A;   INTRAMEDULLARY (IM) NAIL INTERTROCHANTERIC Right 02/07/2022   Procedure: INTRAMEDULLARY (IM) NAIL INTERTROCHANTRIC;  Surgeon: Kendal Franky SQUIBB, MD;  Location: [facility] OR;  Service: Orthopedics;  Laterality: Right;   PARS PLANA VITRECTOMY Right 03/14/2015   Procedure: Exploration Right Ruptured Globe, Vitrous Tap, Antibiotic Injection, PARS PLANA VITRECTOMY 25 GAUGE WITH LASER;  Surgeon: Onesimo Blanch, MD;  Location: [facility] OR;  Service: Ophthalmology;  Laterality: Right;   Allergies[1] Medications Ordered Prior to Encounter[2] Social History   Socioeconomic History   Marital status: Single    Spouse name: Not on file   Number of children: Not on file   Years of education: Not on file   Highest education level: Not on file  Occupational History   Not on file  Tobacco Use   Smoking status: Every Day    Current packs/day: 0.50    Types: Cigarettes   Smokeless tobacco: Never   Tobacco comments:    5-6 cigarettes daily  Substance and Sexual Activity   Alcohol use: Yes    Alcohol/week: 1.0 - 2.0 standard drink of alcohol    Types: 1 - 2 Cans of beer per week    Comment: 1-2 beers daily   Drug use: No   Sexual activity: Not on file  Other Topics Concern   Not on file   Social History Narrative   Not on file   Social Drivers of Health   Tobacco Use: High Risk (12/12/2024)   Patient History    Smoking Tobacco Use: Every Day    Smokeless Tobacco Use: Never    Passive Exposure: Not on file  Financial Resource Strain: Not on file  Food Insecurity: Low Risk (11/18/2022)   Received from [facility]   Epic    Within the past 12 months, you worried that your food would run out before you got money to buy more: Never true    Within the past 12 months, the food you bought just didn't last and you didn't have money to get more: Not on file  Transportation Needs: Not on file (11/18/2022)  Physical Activity: Not on file  Stress: Not on file  Social Connections: Not on file  Intimate Partner Violence: Low Risk  (11/18/2022)   Received from [facility] visits prior to 02/14/2023.   Safety    How often does anyone, including family and friends, physically hurt you?: Never    How often does anyone, including family and friends, insult or talk down to you?: Never    How often does anyone, including family and friends, threaten you with harm?: Never    How often does anyone, including family and friends, scream or curse at you?: Never  Depression (PHQ2-9): Low Risk (12/12/2024)  Depression (PHQ2-9)    PHQ-2 Score: 0  Alcohol Screen: Not on file  Housing: Low Risk (11/18/2022)   Received from [facility]   Epic    What is your living situation today?: I have a steady place to live    Think about the place you live. Do you have problems with any of the following? Choose all that apply:: None/None on this list  Utilities: Low Risk (11/18/2022)   Received from [facility]   Utilities    In the past 12 months has the electric, gas, oil, or water company threatened to shut off services in your home? : No  Health Literacy: Not on file   Family History  Family history unknown: Yes   Health Maintenance  Topic Date Due   Pneumococcal  Vaccine: 50+ Years (1 of 2 - PCV) Never done   COLON CANCER SCREENING ANNUAL FOBT  Never done   Zoster Vaccines- Shingrix (1 of 2) Never done   COVID-19 Vaccine (4 - 2025-26 season) 08/15/2024   DTaP/Tdap/Td (2 - Td or Tdap) 11/05/2030   Influenza Vaccine  Completed   Hepatitis C Screening  Completed   HIV Screening  Completed   Hepatitis B Vaccines 19-59 Average Risk  Aged Out   HPV VACCINES  Aged Out   Meningococcal B Vaccine  Aged Out   Colonoscopy  Discontinued     OBJECTIVE:  Vitals:   12/12/24 0934  BP: 100/63  Pulse: 66  Resp: 16  SpO2: 100%  Weight: 141 lb (64 kg)    Physical Exam Vitals reviewed.  Constitutional:      Comments: BMI 18.60 malnourished   HENT:     Head: Normocephalic.     Right Ear: Tympanic membrane and external ear normal.     Left Ear: Tympanic membrane and external ear normal.     Nose: Nose normal.  Eyes:     Extraocular Movements: Extraocular movements intact.     Pupils: Pupils are equal, round, and reactive to light.  Cardiovascular:     Rate and Rhythm: Normal rate and regular rhythm.  Pulmonary:     Effort: Pulmonary effort is normal.     Breath sounds: Normal breath sounds.  Abdominal:     General: Bowel sounds are normal.     Palpations: Abdomen is soft.  Musculoskeletal:        General: Normal range of motion.  Skin:    General: Skin is warm and dry.  Neurological:     Mental Status: He is oriented to person, place, and time.  Psychiatric:        Mood and Affect: Mood normal.        Behavior: Behavior normal.        Thought Content: Thought content normal.        Judgment: Judgment normal.      ROS  Last 3 Office BP readings: BP Readings from Last 3 Encounters:  12/12/24 100/63  11/24/24 (!) 160/84  09/07/23 (!) 146/72    BMET    Component Value Date/Time   NA 146 (H) 11/24/2024 0957   K 4.3 11/24/2024 0957   CL 105 11/24/2024 0957   CO2 21 11/24/2024 0957   GLUCOSE 83 11/24/2024 0957   GLUCOSE 111 (H)  02/10/2022 0312   BUN 11 11/24/2024 0957   CREATININE 1.10 11/24/2024 0957   CALCIUM 9.3 11/24/2024 0957   GFRNONAA >60 02/10/2022 0312   GFRAA 76 11/05/2020 1207    Renal function: Estimated  Creatinine Clearance: 63 mL/min (by C-G formula based on SCr of 1.1 mg/dL).  Clinical ASCVD: Yes  The 10-year ASCVD risk score (Arnett DK, et al., 2019) is: 12.7%   Values used to calculate the score:     Age: 69 years     Clinically relevant sex: Male     Is Non-Hispanic African American: Yes     Diabetic: No     Tobacco smoker: Yes     Systolic Blood Pressure: 100 mmHg     Is BP treated: Yes     HDL Cholesterol: 80 mg/dL     Total Cholesterol: 158 mg/dL  ASCVD risk factors include- CHAD   ASSESSMENT & PLAN:  Derrick Andrade was seen today for blood pressure check.  Diagnoses and all orders for this visit:  Colon cancer screening -     Ambulatory referral to Gastroenterology  BP check  -Counseled on lifestyle modifications for blood pressure control including reduced dietary sodium, increased exercise, weight reduction and adequate sleep. Also, educated patient about the risk for cardiovascular events, stroke and heart attack. Also counseled patient about the importance of medication adherence. If you participate in smoking, it is important to stop using tobacco as this will increase the risks associated with uncontrolled blood pressure.  Previous visit refilled Bp meds  Goal BP:  For patients younger than 60: Goal BP < 130/80. For patients 60 and older: Goal BP < 140/90. For patients with diabetes: Goal BP < 130/80. Your most recent BP: 100/63   Minimize salt intake. Minimize alcohol intake    This note has been created with Education officer, environmental. Any transcriptional errors are unintentional.   Rosaline SHAUNNA Bohr, NP 12/12/2024, 9:58 AM       [1] No Known Allergies [2]  Current Outpatient Medications on File Prior to Visit  Medication  Sig Dispense Refill   acetaminophen  (TYLENOL ) 325 MG tablet Take 2 tablets (650 mg total) by mouth every 6 (six) hours as needed for mild pain (or temp > 100).     amLODipine  (NORVASC ) 10 MG tablet Take 1 tablet (10 mg total) by mouth daily. 90 tablet 1   apixaban  (ELIQUIS ) 2.5 MG TABS tablet Take 1 tablet (2.5 mg total) by mouth 2 (two) times daily. 60 tablet 0   apixaban  (ELIQUIS ) 2.5 MG TABS tablet Take 1 tablet (2.5 mg total) by mouth 2 (two) times daily. 60 tablet 0   buPROPion  (WELLBUTRIN  SR) 150 MG 12 hr tablet Take 1 tablet (150 mg total) by mouth daily. 30 tablet 0   ergocalciferol  (VITAMIN D2) 1.25 MG (50000 UT) capsule Take 1 capsule (50,000 units total) by mouth every 7 (seven) days. 8 capsule 0   hydrochlorothiazide  (HYDRODIURIL ) 12.5 MG tablet Take 1 tablet (12.5 mg total) by mouth daily. 90 tablet 1   losartan  (COZAAR ) 100 MG tablet Take 1 tablet (100 mg total) by mouth daily. 90 tablet 1   prednisoLONE  acetate (PRED FORTE ) 1 % ophthalmic suspension Place 1 drop into the left eye 3 (three) times daily. 10 mL 11   No current facility-administered medications on file prior to visit.   "

## 2024-12-12 NOTE — Patient Instructions (Addendum)
 Cozzar 100mg  take 1/2  if not scored  take evey other day ( next refill and Bp stable send in 50mg .
